# Patient Record
Sex: Female | Born: 1990 | Race: White | Hispanic: No | Marital: Single | Smoking: Never smoker
Health system: Southern US, Community
[De-identification: ages and names within clinical notes are randomized; demographics above are authoritative.]

## PROBLEM LIST (undated history)

## (undated) DIAGNOSIS — K589 Irritable bowel syndrome without diarrhea: Secondary | ICD-10-CM

## (undated) HISTORY — DX: Irritable bowel syndrome, unspecified: K58.9

---

## 2016-08-17 ENCOUNTER — Ambulatory Visit (INDEPENDENT_AMBULATORY_CARE_PROVIDER_SITE_OTHER): Payer: BLUE CROSS/BLUE SHIELD | Admitting: Family Medicine

## 2016-08-17 VITALS — BP 114/72 | HR 67 | Temp 98.0°F | Resp 17 | Ht 68.0 in | Wt 145.0 lb

## 2016-08-17 DIAGNOSIS — G479 Sleep disorder, unspecified: Secondary | ICD-10-CM | POA: Diagnosis not present

## 2016-08-17 MED ORDER — TRAZODONE HCL 100 MG PO TABS
50.0000 mg | ORAL_TABLET | Freq: Every evening | ORAL | 3 refills | Status: DC | PRN
Start: 2016-08-17 — End: 2016-08-23

## 2016-08-17 NOTE — Progress Notes (Signed)
   HPI  Patient presents today here with sleeping difficulty to establish care.  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  She states that over the last 5 years she struggled with sleep. She has tried several over-the-counter medications including Benadryl and 5 mg of melatonin. Benadryl helps but leaves her groggy in the morning.   She does have a history of anxiety and depression, however she does not feel that she is struggling with this at all right now. She denies suicidal thoughts.  She denies any substance abuse or use. She drinks coffee 1 cup in the morning, otherwise she does not drink caffeine.  She states that she usually goes to bed around midnight, often it takes at least 2 hours to fall asleep and she hopes to sleep until 8 AM. Once she falls asleep she does not have a problem staying asleep. She has had anxiety reaction to Zoloft previously.  PMH: Smoking status noted Past surgical history significant for knee surgery Past medical history of anxiety and depression. Family history significant for stroke and cancer in maternal grandfather, diabetes in paternal grandfather ROS: Per HPI  Objective: BP 114/72 (BP Location: Right Arm, Patient Position: Sitting, Cuff Size: Normal)   Pulse 67   Temp 98 F (36.7 C) (Oral)   Resp 17   Ht 5\' 8"  (1.727 m)   Wt 145 lb (65.8 kg)   SpO2 99%   BMI 22.05 kg/m  Gen: NAD, alert, cooperative with exam HEENT: NCAT, EOMI, PERRL, oropharynx clear, TMs normal bilaterally, CV: RRR, good S1/S2, no murmur Resp: CTABL, no wheezes, non-labored Abd: Positive bowel sounds Ext: No edema, warm Neuro: Alert and oriented, 1+ symmetric patellar tendon reflexes  Assessment and plan:  # Difficulty sleeping Mostly isolated issues following sleep. Her depression and anxiety do not sound like they're active. She previously had anxiety reaction to Zoloft. She has failed over-the-counter medications including melatonin and Benadryl.  She's also tried several herbal remedies. We have discussed sleep hygiene, she has a good routine. Trial of trazodone, start with 50 mg, may titrated 200 mg every night. Suspicion is that she will have good results without "hangover" at 50 or 100 mg.    Meds ordered this encounter  Medications  . traZODone (DESYREL) 100 MG tablet    Sig: Take 0.5-2 tablets (50-200 mg total) by mouth at bedtime as needed for sleep.    Dispense:  60 tablet    Refill:  3    Kevin FentonSamuel Kenijah Benningfield, MD 9:37 AM

## 2016-08-17 NOTE — Patient Instructions (Addendum)
  Great to meet you!  Try trazodone 30 minutes before bed, start with 1 tab every night, you can go down to 1/2 pill or up to 2 pills at night.       IF you received an x-ray today, you will receive an invoice from [facility]. Please contact [facility] at [phone] with questions or concerns regarding your invoice.   IF you received labwork today, you will receive an invoice from [facility]. Please contact [facility] at [phone] with questions or concerns regarding your invoice.   Our billing staff will not be able to assist you with questions regarding bills from these companies.  You will be contacted with the lab results as soon as they are available. The fastest way to get your results is to activate your My Chart account. Instructions are located on the last page of this paperwork. If you have not heard from us regarding the results in 2 weeks, please contact this office.

## 2016-08-23 ENCOUNTER — Ambulatory Visit (INDEPENDENT_AMBULATORY_CARE_PROVIDER_SITE_OTHER): Payer: BLUE CROSS/BLUE SHIELD | Admitting: Family Medicine

## 2016-08-23 VITALS — BP 120/76 | HR 50 | Temp 97.7°F | Resp 16 | Ht 68.0 in | Wt 138.0 lb

## 2016-08-23 DIAGNOSIS — F411 Generalized anxiety disorder: Secondary | ICD-10-CM

## 2016-08-23 DIAGNOSIS — G479 Sleep disorder, unspecified: Secondary | ICD-10-CM | POA: Diagnosis not present

## 2016-08-23 MED ORDER — MIRTAZAPINE 7.5 MG PO TABS: 7.5000 mg | ORAL_TABLET | Freq: Every day | ORAL | 0 refills | Status: DC

## 2016-08-23 NOTE — Progress Notes (Signed)
Chief Complaint  Patient presents with  . Medication Problem    pt. says wants Lynn different med. its not working     HPI  She reports that she has noticed that it is easier to fall asleep with Trazodone but notices that she still wakes up at 3am or 6 am and notices that she cannot go back to sleep. She is also dizzy with Trazodone.  She reports that her friend has an antianxiety medication and when she tried it she slept and stayed asleep and even overslept. She "thinks that it was some medication called benzo that starts with Lynn".  She has had five years of difficulty with sleep.   She also previously tried Concerta She has also tried Lynn few other medications for attention disorder and prior anxiety. She drinks 3 alcoholic drinks Lynn week.  No past medical history on file.  Current Outpatient Prescriptions  Medication Sig Dispense Refill  . mirtazapine (REMERON) 7.5 MG tablet Take 1 tablet (7.5 mg total) by mouth at bedtime. 15 tablet 0   No current facility-administered medications for this visit.     Allergies:  Allergies  Allergen Reactions  . Zoloft [Sertraline Hcl] Anxiety    No past surgical history on file.  Social History   Social History  . Marital status: Single    Spouse name: N/Lynn  . Number of children: N/Lynn  . Years of education: N/Lynn   Social History Main Topics  . Smoking status: Never Smoker  . Smokeless tobacco: Never Used  . Alcohol use Yes  . Drug use: No  . Sexual activity: Not Asked   Other Topics Concern  . None   Social History Narrative  . None    ROS  Objective: Vitals:   08/23/16 0853  BP: 120/76  Pulse: (!) 50  Resp: 16  Temp: 97.7 F (36.5 C)  TempSrc: Oral  SpO2: 99%  Weight: 138 lb (62.6 kg)  Height: 5\' 8"  (1.727 m)    Physical Exam  Constitutional: She is oriented to person, place, and time. She appears well-developed and well-nourished.  HENT:  Head: Normocephalic and atraumatic.  Eyes: Conjunctivae and EOM are  normal.  Pulmonary/Chest: Effort normal.  Neurological: She is alert and oriented to person, place, and time.  Psychiatric: She has Lynn normal mood and affect. Her behavior is normal. Judgment and thought content normal.      Assessment and Plan Lynn Cole was seen today for medication problem.  Diagnoses and all orders for this visit:  Difficulty sleeping -     Ambulatory referral to Psychiatry -     mirtazapine (REMERON) 7.5 MG tablet; Take 1 tablet (7.5 mg total) by mouth at bedtime.  Anxiety state -     Ambulatory referral to Psychiatry -     mirtazapine (REMERON) 7.5 MG tablet; Take 1 tablet (7.5 mg total) by mouth at bedtime.  Discussed with patient that since she has tried multiple classes of anxiety medications and various different sleep aides would recommend that she adhere to Lynn more strict sleep routine by going to bed at 10pm and waking up at 6am rather than taking Lynn medication so that she can achieve more sleep in the mornings to get enough rest for the day.  Discussed that her circadian rhythm wakes her up at 6am so it is best to listen to her body. Discussed with her that cortisol levels spike at different times but research shows that Lynn dawn spike can lead to people waking  up earlier therefore she should go to bed at an earlier time She indicated using alcohol 3 times Lynn week Discussed with her that alcohol is Lynn depressant and can affect sleep patterns therefore she should stop alcohol use Also she should never combine alcohol with medications Discussed with her that she should follow up with Psychiatry. She plans to see Psychiattry back in [place] over her winter break to establish care.  Referral generated at this office was cancelled.  Discussed risks and benefits of Mirtazapine. Discussed common side effects Discussed that she should take the medication at bedtime.  Lynn total of 40 minutes were spent face-to-face with the patient during this encounter and over half of  that time was spent on counseling and coordination of care.  Lynn Cole Lynn Cole

## 2016-08-23 NOTE — Patient Instructions (Addendum)
Insomnia Insomnia is a sleep disorder that makes it difficult to fall asleep or to stay asleep. Insomnia can cause tiredness (fatigue), low energy, difficulty concentrating, mood swings, and poor performance at work or school. There are three different ways to classify insomnia:  Difficulty falling asleep.  Difficulty staying asleep.  Waking up too early in the morning. Any type of insomnia can be long-term (chronic) or short-term (acute). Both are common. Short-term insomnia usually lasts for three months or less. Chronic insomnia occurs at least three times a week for longer than three months. What are the causes? Insomnia may be caused by another condition, situation, or substance, such as:  Anxiety.  Certain medicines.  Gastroesophageal reflux disease (GERD) or other gastrointestinal conditions.  Asthma or other breathing conditions.  Restless legs syndrome, sleep apnea, or other sleep disorders.  Chronic pain.  Menopause. This may include hot flashes.  Stroke.  Abuse of alcohol, tobacco, or illegal drugs.  Depression.  Caffeine.  Neurological disorders, such as Alzheimer disease.  An overactive thyroid (hyperthyroidism). The cause of insomnia may not be known. What increases the risk? Risk factors for insomnia include:  Gender. Women are more commonly affected than men.  Age. Insomnia is more common as you get older.  Stress. This may involve your professional or personal life.  Income. Insomnia is more common in people with lower income.  Lack of exercise.  Irregular work schedule or night shifts.  Traveling between different time zones. What are the signs or symptoms? If you have insomnia, trouble falling asleep or trouble staying asleep is the main symptom. This may lead to other symptoms, such as:  Feeling fatigued.  Feeling nervous about going to sleep.  Not feeling rested in the morning.  Having trouble concentrating.  Feeling irritable,  anxious, or depressed. How is this treated? Treatment for insomnia depends on the cause. If your insomnia is caused by an underlying condition, treatment will focus on addressing the condition. Treatment may also include:  Medicines to help you sleep.  Counseling or therapy.  Lifestyle adjustments. Follow these instructions at home:  Take medicines only as directed by your health care provider.  Keep regular sleeping and waking hours. Avoid naps.  Keep a sleep diary to help you and your health care provider figure out what could be causing your insomnia. Include:  When you sleep.  When you wake up during the night.  How well you sleep.  How rested you feel the next day.  Any side effects of medicines you are taking.  What you eat and drink.  Make your bedroom a comfortable place where it is easy to fall asleep:  Put up shades or special blackout curtains to block light from outside.  Use a white noise machine to block noise.  Keep the temperature cool.  Exercise regularly as directed by your health care provider. Avoid exercising right before bedtime.  Use relaxation techniques to manage stress. Ask your health care provider to suggest some techniques that may work well for you. These may include:  Breathing exercises.  Routines to release muscle tension.  Visualizing peaceful scenes.  Cut back on alcohol, caffeinated beverages, and cigarettes, especially close to bedtime. These can disrupt your sleep.  Do not overeat or eat spicy foods right before bedtime. This can lead to digestive discomfort that can make it hard for you to sleep.  Limit screen use before bedtime. This includes:  Watching TV.  Using your smartphone, tablet, and computer.  Stick to a   routine. This can help you fall asleep faster. Try to do a quiet activity, brush your teeth, and go to bed at the same time each night.  Get out of bed if you are still awake after 15 minutes of trying to  sleep. Keep the lights down, but try reading or doing a quiet activity. When you feel sleepy, go back to bed.  Make sure that you drive carefully. Avoid driving if you feel very sleepy.  Keep all follow-up appointments as directed by your health care provider. This is important. Contact a health care provider if:  You are tired throughout the day or have trouble in your daily routine due to sleepiness.  You continue to have sleep problems or your sleep problems get worse. Get help right away if:  You have serious thoughts about hurting yourself or someone else. This information is not intended to replace advice given to you by your health care provider. Make sure you discuss any questions you have with your health care provider. Document Released: 09/16/2000 Document Revised: 02/19/2016 Document Reviewed: 06/20/2014 Elsevier Interactive Patient Education  2017 Elsevier Inc.  

## 2016-08-29 ENCOUNTER — Telehealth: Payer: Self-pay

## 2016-08-29 DIAGNOSIS — F411 Generalized anxiety disorder: Secondary | ICD-10-CM

## 2016-08-29 DIAGNOSIS — G479 Sleep disorder, unspecified: Secondary | ICD-10-CM

## 2016-08-29 NOTE — Telephone Encounter (Signed)
Pt is needing a refill on her insomnia medication   Best number [phone]

## 2016-09-02 NOTE — Telephone Encounter (Signed)
Dr Creta LevinStallings, do you want to give RFs? I changed quantity to 30 day supply.

## 2016-09-04 MED ORDER — MIRTAZAPINE 7.5 MG PO TABS: 7.5000 mg | ORAL_TABLET | Freq: Every day | ORAL | 6 refills | Status: DC

## 2016-09-04 NOTE — Telephone Encounter (Signed)
Medication sent in with refills.

## 2017-09-13 ENCOUNTER — Emergency Department (HOSPITAL_COMMUNITY): Admission: EM | Admit: 2017-09-13 | Discharge: 2017-09-13 | Discharge status: Absent without leave | Payer: Self-pay

## 2018-06-05 ENCOUNTER — Emergency Department (HOSPITAL_COMMUNITY)
Admission: EM | Admit: 2018-06-05 | Discharge: 2018-06-06 | Disposition: A | Discharge status: Home / Self Care | Payer: BLUE CROSS/BLUE SHIELD | Attending: Emergency Medicine | Admitting: Emergency Medicine

## 2018-06-05 ENCOUNTER — Encounter (HOSPITAL_COMMUNITY): Payer: Self-pay | Admitting: Emergency Medicine

## 2018-06-05 ENCOUNTER — Other Ambulatory Visit: Payer: Self-pay

## 2018-06-05 DIAGNOSIS — M21332 Wrist drop, left wrist: Secondary | ICD-10-CM | POA: Insufficient documentation

## 2018-06-05 DIAGNOSIS — Z79899 Other long term (current) drug therapy: Secondary | ICD-10-CM | POA: Diagnosis not present

## 2018-06-05 DIAGNOSIS — G839 Paralytic syndrome, unspecified: Secondary | ICD-10-CM | POA: Diagnosis not present

## 2018-06-05 DIAGNOSIS — R200 Anesthesia of skin: Secondary | ICD-10-CM | POA: Diagnosis present

## 2018-06-05 NOTE — ED Triage Notes (Addendum)
Patient states she was cracking her knuckle and now her hand/forearm is numb. This started today. Patient did cocaine yesterday.

## 2018-06-06 ENCOUNTER — Emergency Department (HOSPITAL_COMMUNITY): Payer: BLUE CROSS/BLUE SHIELD

## 2018-06-06 LAB — RAPID URINE DRUG SCREEN, HOSP PERFORMED
Amphetamines: POSITIVE — AB
Barbiturates: NOT DETECTED
Benzodiazepines: NOT DETECTED
Cocaine: POSITIVE — AB
Opiates: NOT DETECTED
Tetrahydrocannabinol: NOT DETECTED

## 2018-06-06 LAB — COMPREHENSIVE METABOLIC PANEL
ALT: 20 U/L (ref 0–44)
AST: 32 U/L (ref 15–41)
Albumin: 4.4 g/dL (ref 3.5–5.0)
Alkaline Phosphatase: 54 U/L (ref 38–126)
Anion gap: 11 (ref 5–15)
BUN: 11 mg/dL (ref 6–20)
CO2: 27 mmol/L (ref 22–32)
Calcium: 9.5 mg/dL (ref 8.9–10.3)
Chloride: 103 mmol/L (ref 98–111)
Creatinine, Ser: 0.79 mg/dL (ref 0.44–1.00)
GFR calc Af Amer: >60 mL/min (ref >60)
GFR calc non Af Amer: >60 mL/min (ref >60)
Glucose, Bld: 91 mg/dL (ref 70–99)
Potassium: 3.5 mmol/L (ref 3.5–5.1)
Sodium: 141 mmol/L (ref 135–145)
Total Bilirubin: 0.6 mg/dL (ref 0.3–1.2)
Total Protein: 6.3 g/dL — ABNORMAL LOW (ref 6.5–8.1)

## 2018-06-06 LAB — CBC WITH DIFFERENTIAL/PLATELET
Basophils Absolute: 0 10*3/uL (ref 0.0–0.1)
Basophils Relative: 1 %
Eosinophils Absolute: 0.3 10*3/uL (ref 0.0–0.7)
Eosinophils Relative: 5 %
HCT: 36.3 % (ref 36.0–46.0)
Hemoglobin: 12.4 g/dL (ref 12.0–15.0)
Lymphocytes Relative: 39 %
Lymphs Abs: 2.8 10*3/uL (ref 0.7–4.0)
MCH: 32.3 pg (ref 26.0–34.0)
MCHC: 34.2 g/dL (ref 30.0–36.0)
MCV: 94.5 fL (ref 78.0–100.0)
Monocytes Absolute: 0.6 10*3/uL (ref 0.1–1.0)
Monocytes Relative: 8 %
Neutro Abs: 3.5 10*3/uL (ref 1.7–7.7)
Neutrophils Relative %: 47 %
Platelets: 230 10*3/uL (ref 150–400)
RBC: 3.84 MIL/uL — ABNORMAL LOW (ref 3.87–5.11)
RDW: 12.8 % (ref 11.5–15.5)
WBC: 7.3 10*3/uL (ref 4.0–10.5)

## 2018-06-06 LAB — I-STAT BETA HCG BLOOD, ED (MC, WL, AP ONLY): I-stat hCG, quantitative: <5 m[IU]/mL (ref <5)

## 2018-06-06 MED ORDER — DIAZEPAM 5 MG PO TABS: 10.0000 mg | ORAL_TABLET | Freq: Once | ORAL | Status: DC

## 2018-06-06 NOTE — Discharge Instructions (Signed)
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  Please wear wrist brace for support.  Call and follow up closely with neurologist for further evaluation.  Please avoid cocaine use as it is harmful to your health.

## 2018-06-06 NOTE — ED Notes (Signed)
Patient transported to MRI 

## 2018-06-06 NOTE — Consult Note (Signed)
[facility]  Impression:    The patient has a left sided wrist flop, she cannot lift her left wrist.   The patient was woke up in the morning yesteday.   She does not have a cold, or cough, no fever, she cannot move the left hand however, she cannot lift it.   She did cocaine yesterday, and she is on a lot of medications.   She takes buspirone, hydroxizine.   27 year old for wrist drop on the left side, with cocaine use. NO TPA bc she is outside of the window last knonw normal was two nights prior.  She has left side wrist drop.   ----- CT Head, and if negative for hemorrhage cta head/neck.  pscych consult MRI brain without contrast Consider EEG if MRI is negative.  Urine Pregnancy Test  No Asprin until bleed is ruled out.  Consdier as stroke until ruled out, by MR.  Inpatient neurology consult.  Comments:   STAT    Recommendations:   <220/110    Start antiplatelet if no obvious contraindication  Stroke protocol admission/ orderset suggested with placement on stroke floor  tele monitoring  Bedside swallow evaluation  HOB less than 30 degrees  IV Fluid hydration with NS  Euglycemia  avoid hyperthermia, PRN acetaminophen  dvt ppx  Consider inpatient neurology consultation  Discussed with ED MD  Please call with questions  -----------------------------------------------------------------------------------------  CC  History of Present Illness    The patient was up and walking and taling at 7:00 pm she was normal, up walking and talking per history.    Diagnostic:  CT head is negative.   Exam:  Patient is in no apparent distress. Patient appears as stated age. No obvious acute respiratory or cardiac distress. Patient is well groomed and well-nourished.  NIHSS score:   1A: Level of Consciousness - Requires repeated stimulation to arouse 0  1B: Ask Month and Age - 0 Questions Right 0  1C: 'Blink  Eyes' & 'Squeeze Hands' - Performs 0 Tasks 0  2: Test Horizontal Extraocular Movements - Partial Gaze Palsy: Corrects with Oculocephalic Reflex 0  3: Test Visual Fields - No Visual Loss 0  4: Test Facial Palsy - Normal symmetry 0  5A: Test Left Arm Motor Drift - Some Effort Against Gravity 0  5B: Test Right Arm Motor Drift - Some Effort Against Gravity0  6A: Test Left Leg Motor Drift - Some Effort Against Gravity 0  6B: Test Right Leg Motor Drift - Some Effort Against Gravity 0  7: Test Limb Ataxia - Ataxia in 2 Limbs 0  8: Test Sensation - Complete Loss: Cannot Sense Being Touched At All 0  9: Test Language/Aphasia- Severe Aphasia: Fragmentary Expression, Inference Needed, Cannot Identify Materials 0  10: Test Dysarthria - Mute/Anarthric 0  11: Test Extinction/Inattention - Extinction to bilateral simultaneous stimulation 0     Medical Decision Making:  - Extensive number of diagnosis or management options are considered above.  - Extensive amount of complex data reviewed.  - High risk of complication and/or morbidity or mortality are associated with differential diagnostic considerations above.  - There may be Uncertain outcome and increased probability of prolonged functional impairment or high probability of severe prolonged functional impairment associated with some of these differential diagnosis.  Medical Data Reviewed:  1.Data reviewed include clinical labs, radiology,Medical Tests;  2.Tests results discussed w/performing or interpreting physician;  3.Obtaining/reviewing old medical records;  4.Obtaining case history from another source;  5.Independent  review of image, tracing or specimen

## 2018-06-06 NOTE — ED Provider Notes (Signed)
C/o inability to move L wrist.  Cocaine last night.  ?focal infarct of radial nerve.  Currently awaits MRI.  If neg, can f/u with neuro outpt.    UDS positive for cocaine and amphetamine.  Previous test is negative, electrolytes panels are reassuring, normal liver function panel, normal WBC and normal H&H.  Initial head CT scan unremarkable, brain MRI and head MRA without concerning feature.    sxs not improving. No neck, axillary, or elbow pain.  Radial pulses 2+, diminished grip strength.  Suspect nerve palsy of indeterminate cause. Encourage cessation of Cocaine and to f/u outpt with neuro.  Wrist brace provided.  Pt voice understanding and agrees with plan.   BP 95/60   Pulse 84   Temp 97.9 F (36.6 C) (Oral)   Resp 17   Ht 5' 7.25" (1.708 m)   Wt 59 kg   SpO2 100%   BMI 20.21 kg/m   Results for orders placed or performed during the hospital encounter of 06/05/18  Comprehensive metabolic panel  Result Value Ref Range   Sodium 141 135 - 145 mmol/L   Potassium 3.5 3.5 - 5.1 mmol/L   Chloride 103 98 - 111 mmol/L   CO2 27 22 - 32 mmol/L   Glucose, Bld 91 70 - 99 mg/dL   BUN 11 6 - 20 mg/dL   Creatinine, Ser 1.61 0.44 - 1.00 mg/dL   Calcium 9.5 8.9 - 09.6 mg/dL   Total Protein 6.3 (L) 6.5 - 8.1 g/dL   Albumin 4.4 3.5 - 5.0 g/dL   AST 32 15 - 41 U/L   ALT 20 0 - 44 U/L   Alkaline Phosphatase 54 38 - 126 U/L   Total Bilirubin 0.6 0.3 - 1.2 mg/dL   GFR calc non Af Amer >60 >60 mL/min   GFR calc Af Amer >60 >60 mL/min   Anion gap 11 5 - 15  CBC with Differential  Result Value Ref Range   WBC 7.3 4.0 - 10.5 K/uL   RBC 3.84 (L) 3.87 - 5.11 MIL/uL   Hemoglobin 12.4 12.0 - 15.0 g/dL   HCT 04.5 40.9 - 81.1 %   MCV 94.5 78.0 - 100.0 fL   MCH 32.3 26.0 - 34.0 pg   MCHC 34.2 30.0 - 36.0 g/dL   RDW 91.4 78.2 - 95.6 %   Platelets 230 150 - 400 K/uL   Neutrophils Relative % 47 %   Neutro Abs 3.5 1.7 - 7.7 K/uL   Lymphocytes Relative 39 %   Lymphs Abs 2.8 0.7 - 4.0 K/uL   Monocytes Relative 8 %   Monocytes Absolute 0.6 0.1 - 1.0 K/uL   Eosinophils Relative 5 %   Eosinophils Absolute 0.3 0.0 - 0.7 K/uL   Basophils Relative 1 %   Basophils Absolute 0.0 0.0 - 0.1 K/uL  Urine rapid drug screen (hosp performed)  Result Value Ref Range   Opiates NONE DETECTED NONE DETECTED   Cocaine POSITIVE (A) NONE DETECTED   Benzodiazepines NONE DETECTED NONE DETECTED   Amphetamines POSITIVE (A) NONE DETECTED   Tetrahydrocannabinol NONE DETECTED NONE DETECTED   Barbiturates NONE DETECTED NONE DETECTED  I-Stat beta hCG blood, ED  Result Value Ref Range   I-stat hCG, quantitative <5.0 <5 mIU/mL   Comment 3           Ct Head Wo Contrast  Result Date: 06/06/2018 CLINICAL DATA:  Initial evaluation for acute right arm and hand weakness. EXAM: CT HEAD WITHOUT CONTRAST TECHNIQUE: Contiguous axial images  were obtained from the base of the skull through the vertex without intravenous contrast. COMPARISON:  None. FINDINGS: Brain: Cerebral volume within normal limits for patient age. No evidence for acute intracranial hemorrhage. No findings to suggest acute large vessel territory infarct. No mass lesion, midline shift, or mass effect. Ventricles are normal in size without evidence for hydrocephalus. No extra-axial fluid collection identified. Vascular: No hyperdense vessel identified. Skull: Scalp soft tissues demonstrate no acute abnormality. Calvarium intact. Sinuses/Orbits: Globes and orbital soft tissues within normal limits. Visualized paranasal sinuses are clear. No mastoid effusion. IMPRESSION: Normal head CT.  No acute intracranial abnormality identified. Electronically Signed   By: Rise Mu M.D.   On: 06/06/2018 06:36   Mr Maxine Glenn Head Wo Contrast  Result Date: 06/06/2018 CLINICAL DATA:  Left wrist drop.  Cocaine use. EXAM: MRI HEAD WITHOUT CONTRAST MRA HEAD WITHOUT CONTRAST TECHNIQUE: Multiplanar, multiecho pulse sequences of the brain and surrounding structures were  obtained without intravenous contrast. Angiographic images of the head were obtained using MRA technique without contrast. COMPARISON:  Head CT 06/06/2018 FINDINGS: MRI HEAD FINDINGS Brain: There is no evidence of acute infarct, intracranial hemorrhage, mass, midline shift, or extra-axial fluid collection. The ventricles and sulci are normal. The brain is normal in signal. Vascular: Major intracranial vascular flow voids are preserved. Skull and upper cervical spine: Unremarkable bone marrow signal. Sinuses/Orbits: Unremarkable orbits. Mild scattered mucosal thickening in the paranasal sinuses. Clear mastoid air cells. Other: None. MRA HEAD FINDINGS The study is mildly motion degraded. The visualized distal vertebral arteries are widely patent to the basilar and codominant. Patent PICA and SCA origins are identified bilaterally. The basilar artery is widely patent. There are moderately large posterior communicating arteries bilaterally. PCAs are patent without evidence of significant stenosis within limitations of motion artifact. The internal carotid arteries are widely patent from skull base to carotid termini. ACAs and MCAs are patent without evidence of proximal branch occlusion or significant stenosis. No aneurysm is identified. IMPRESSION: 1. Negative head MRI. 2. Negative head MRA. Electronically Signed   By: Sebastian Ache M.D.   On: 06/06/2018 11:09   Mr Brain Wo Contrast  Result Date: 06/06/2018 CLINICAL DATA:  Left wrist drop.  Cocaine use. EXAM: MRI HEAD WITHOUT CONTRAST MRA HEAD WITHOUT CONTRAST TECHNIQUE: Multiplanar, multiecho pulse sequences of the brain and surrounding structures were obtained without intravenous contrast. Angiographic images of the head were obtained using MRA technique without contrast. COMPARISON:  Head CT 06/06/2018 FINDINGS: MRI HEAD FINDINGS Brain: There is no evidence of acute infarct, intracranial hemorrhage, mass, midline shift, or extra-axial fluid collection. The  ventricles and sulci are normal. The brain is normal in signal. Vascular: Major intracranial vascular flow voids are preserved. Skull and upper cervical spine: Unremarkable bone marrow signal. Sinuses/Orbits: Unremarkable orbits. Mild scattered mucosal thickening in the paranasal sinuses. Clear mastoid air cells. Other: None. MRA HEAD FINDINGS The study is mildly motion degraded. The visualized distal vertebral arteries are widely patent to the basilar and codominant. Patent PICA and SCA origins are identified bilaterally. The basilar artery is widely patent. There are moderately large posterior communicating arteries bilaterally. PCAs are patent without evidence of significant stenosis within limitations of motion artifact. The internal carotid arteries are widely patent from skull base to carotid termini. ACAs and MCAs are patent without evidence of proximal branch occlusion or significant stenosis. No aneurysm is identified. IMPRESSION: 1. Negative head MRI. 2. Negative head MRA. Electronically Signed   By: Sebastian Ache M.D.   On: 06/06/2018  11:09      Fayrene Helper, PA-C 06/06/18 1158    Mesner, Barbara Cower, MD 06/06/18 [phone]

## 2018-06-06 NOTE — ED Provider Notes (Signed)
[facility]-EMERGENCY DEPT Provider Note   CSN: [id number] Arrival date & time: 06/05/18  2054     History   Chief Complaint Chief Complaint  Patient presents with  . Numbness    HPI Lynn Cole is a 27 y.o. female.  Patient is here for evaluation of left wrist weakness that started last night. She is unable to flex or extend the wrist voluntarily. No loss of strength or movement of digits of left hand, left elbow or shoulder. She feels the hand and forearm are numb but the weakness affects only the wrist. No headache, neck pain, injury. No pain to the left upper extremity. No swelling or discoloration. No history of similar symptoms. The patient reports snorting cocaine last night and is concerned the symptoms are related to this.   The history is provided by the patient. No language interpreter was used.    History reviewed. No pertinent past medical history.  Patient Active Problem List   Diagnosis Date Noted  . Difficulty sleeping 08/17/2016    History reviewed. No pertinent surgical history.   OB History   None      Home Medications    Prior to Admission medications   Medication Sig Start Date End Date Taking? Authorizing Provider  amphetamine-dextroamphetamine (ADDERALL) 10 MG tablet Take 10 mg by mouth daily. 05/31/18  Yes [provider]  busPIRone (BUSPAR) 15 MG tablet Take 5-15 mg by mouth 2 (two) times daily. 5 mg in the morning and 15 MG in the morning 05/31/18  Yes [provider]  hydrOXYzine (ATARAX/VISTARIL) 25 MG tablet Take 25 mg by mouth at bedtime as needed (sleep).  05/31/18  Yes [provider]  lamoTRIgine (LAMICTAL) 25 MG tablet Take 25 mg by mouth daily.   Yes [provider]  MELATONIN PO Take 1 tablet by mouth at bedtime as needed (sleep).   Yes [provider]  naproxen sodium (ALEVE) 220 MG tablet Take 220 mg by mouth daily as needed (pain).   Yes [provider]    VYVANSE 30 MG capsule Take 30 mg by mouth daily. 05/30/18  Yes [provider]  mirtazapine (REMERON) 7.5 MG tablet Take 1 tablet (7.5 mg total) by mouth at bedtime. Patient not taking: Reported on 06/06/2018 09/04/16   Doristine Bosworth, MD    Family History History reviewed. No pertinent family history.  Social History Social History   Tobacco Use  . Smoking status: Never Smoker  . Smokeless tobacco: Never Used  Substance Use Topics  . Alcohol use: Yes  . Drug use: Yes    Types: Cocaine     Allergies   Zoloft [sertraline hcl]   Review of Systems Review of Systems  Constitutional: Negative for chills and fever.  HENT: Negative.   Respiratory: Negative.   Cardiovascular: Negative.   Gastrointestinal: Negative.   Musculoskeletal: Negative.   Skin: Negative.   Neurological: Positive for weakness and numbness.       See HPI.     Physical Exam Updated Vital Signs BP 108/77 (BP Location: Left Arm)   Pulse (!) 49   Temp 98.4 F (36.9 C) (Oral)   Resp 16   Ht 5' 7.25" (1.708 m)   Wt 59 kg   SpO2 100%   BMI 20.21 kg/m   Physical Exam  Constitutional: She is oriented to person, place, and time. She appears well-developed and well-nourished.  Neck: Normal range of motion.  Cardiovascular: Intact distal pulses.  Pulmonary/Chest: Effort normal.  No respiratory distress.  Musculoskeletal:  Left wrist is limp. There is full, pain free passive ROM but no voluntary flexion or extension of wrist. Full grip strength with all digits. Full movement of elbow and shoulder with normal strength. No midline or cervical spine tenderness.   Neurological: She is alert and oriented to person, place, and time. She exhibits abnormal muscle tone.  Only mild decreased sensation to light touch of the left hand.   Skin: Skin is warm and dry. Capillary refill takes less than 2 seconds.  Psychiatric: She has a normal mood and affect.     ED Treatments / Results  Labs (all labs  ordered are listed, but only abnormal results are displayed) Labs Reviewed - No data to display  EKG None  Radiology No results found.  Procedures Procedures (including critical care time)  Medications Ordered in ED Medications - No data to display   Initial Impression / Assessment and Plan / ED Course  I have reviewed the triage vital signs and the nursing notes.  Pertinent labs & imaging results that were available during my care of the patient were reviewed by me and considered in my medical decision making (see chart for details).     Patient is here with loss of voluntary control of the left wrist while she maintains control with full strength of digits, elbow and shoulder of left extremity. Minimal subjective sensory deficits.   Concern for focal infarct, at risk secondary to cocaine use. Tele neuro consult requested with 1.5 hour delay in contacting neurologist. When consulted he recommends a head CT as initial neuroimaging, and MR and MRA of head and neck if negative CT. Patient is aware of plan and agreeable to staying for studies.   Patient care signed out to oncoming treatment team for follow up on recommended studies.  Final Clinical Impressions(s) / ED Diagnoses   Final diagnoses:  None   1. Left wrist paralysis  ED Discharge Orders    None       Elpidio Anis, PA-C 06/06/18 0703    Derwood Kaplan, MD 06/07/18 [phone]

## 2018-06-07 ENCOUNTER — Ambulatory Visit: Payer: BLUE CROSS/BLUE SHIELD | Admitting: Neurology

## 2018-06-07 ENCOUNTER — Encounter: Payer: Self-pay | Admitting: Neurology

## 2018-06-07 VITALS — BP 110/58 | HR 50 | Ht 67.25 in | Wt 121.0 lb

## 2018-06-07 DIAGNOSIS — R200 Anesthesia of skin: Secondary | ICD-10-CM

## 2018-06-07 DIAGNOSIS — R202 Paresthesia of skin: Secondary | ICD-10-CM | POA: Diagnosis not present

## 2018-06-07 DIAGNOSIS — G5632 Lesion of radial nerve, left upper limb: Secondary | ICD-10-CM

## 2018-06-07 NOTE — Progress Notes (Signed)
NEUROLOGY CONSULTATION NOTE  Lynn Cole MRN: [id number] DOB: Dec 18, 1990  Referring provider: Marily Memos, MD (ED referral) Primary care provider: No PCP.  Reason for consult:  left wrist drop  HISTORY OF PRESENT ILLNESS: Lynn Cole is a 27 year old right-handed female who presents for left wrist drop.  History supplemented by ED note.   On Tuesday, she woke up with a left wrist drop.  She used cocaine the previous day.  She presented to [facility] ED that evening for further evaluation.  CT/MRI/MRA of head were personally reviewed and were normal.  Urine drug screen was positive for cocaine.  She was provided a wrist splint and referred to outpatient neurology.  She cannot extend her wrist.  She feels numbness along the medial aspect of the left hand including index and thumb.  She denies neck pain.  She denies neck pain or radicular pain down the left arm.  Sometimes, she notes numbness and tingling in the right hand.    Her maternal grandmother had ALS.  PAST MEDICAL HISTORY: Mood disorder Depression  PAST SURGICAL HISTORY: No past surgical history on file.  MEDICATIONS: Current Outpatient Medications on File Prior to Visit  Medication Sig Dispense Refill  . amphetamine-dextroamphetamine (ADDERALL) 10 MG tablet Take 10 mg by mouth daily.  0  . busPIRone (BUSPAR) 15 MG tablet Take 5-15 mg by mouth 2 (two) times daily. 5 mg in the morning and 15 MG in the morning  0  . hydrOXYzine (ATARAX/VISTARIL) 25 MG tablet Take 25 mg by mouth at bedtime as needed (sleep).   2  . lamoTRIgine (LAMICTAL) 25 MG tablet Take 25 mg by mouth daily.    Marland Kitchen MELATONIN PO Take 1 tablet by mouth at bedtime as needed (sleep).    . mirtazapine (REMERON) 7.5 MG tablet Take 1 tablet (7.5 mg total) by mouth at bedtime. (Patient not taking: Reported on 06/06/2018) 30 tablet 6  . naproxen sodium (ALEVE) 220 MG tablet Take 220 mg by mouth daily as needed (pain).    Marland Kitchen VYVANSE 30 MG capsule Take 30 mg by  mouth daily.  0   No current facility-administered medications on file prior to visit.     ALLERGIES: Allergies  Allergen Reactions  . Zoloft [Sertraline Hcl] Anxiety    FAMILY HISTORY: Maternal grandmother:  ALS  SOCIAL HISTORY: Social History   Socioeconomic History  . Marital status: Single    Spouse name: Not on file  . Number of children: Not on file  . Years of education: Not on file  . Highest education level: Not on file  Occupational History  . Not on file  Social Needs  . Financial resource strain: Not on file  . Food insecurity:    Worry: Not on file    Inability: Not on file  . Transportation needs:    Medical: Not on file    Non-medical: Not on file  Tobacco Use  . Smoking status: Never Smoker  . Smokeless tobacco: Never Used  Substance and Sexual Activity  . Alcohol use: Yes  . Drug use: Yes    Types: Cocaine  . Sexual activity: Not on file  Lifestyle  . Physical activity:    Days per week: Not on file    Minutes per session: Not on file  . Stress: Not on file  Relationships  . Social connections:    Talks on phone: Not on file    Gets together: Not on file    Attends religious  service: Not on file    Active member of club or organization: Not on file    Attends meetings of clubs or organizations: Not on file    Relationship status: Not on file  . Intimate partner violence:    Fear of current or ex partner: Not on file    Emotionally abused: Not on file    Physically abused: Not on file    Forced sexual activity: Not on file  Other Topics Concern  . Not on file  Social History Narrative  . Not on file    REVIEW OF SYSTEMS: Constitutional: No fevers, chills, or sweats, no generalized fatigue, change in appetite Eyes: No visual changes, double vision, eye pain Ear, nose and throat: No hearing loss, ear pain, nasal congestion, sore throat Cardiovascular: No chest pain, palpitations Respiratory:  No shortness of breath at rest or with  exertion, wheezes GastrointestinaI: No nausea, vomiting, diarrhea, abdominal pain, fecal incontinence Genitourinary:  No dysuria, urinary retention or frequency Musculoskeletal:  No neck pain, back pain Integumentary: No rash, pruritus, skin lesions Neurological: as above Psychiatric: anxiety Endocrine: No palpitations, fatigue, diaphoresis, mood swings, change in appetite, change in weight, increased thirst Hematologic/Lymphatic:  No purpura, petechiae. Allergic/Immunologic: no itchy/runny eyes, nasal congestion, recent allergic reactions, rashes  PHYSICAL EXAM: Blood pressure (!) 110/58, pulse (!) 50, height 5' 7.25" (1.708 m), weight 121 lb (54.9 kg), SpO2 99 %. General: No acute distress.  Patient appears well-groomed.  Head:  Normocephalic/atraumatic Eyes:  fundi examined but not visualized Neck: supple, no paraspinal tenderness, full range of motion Back: No paraspinal tenderness Heart: regular rate and rhythm Lungs: Clear to auscultation bilaterally. Vascular: No carotid bruits. Neurological Exam: Mental status: alert and oriented to person, place, and time, recent and remote memory intact, fund of knowledge intact, attention and concentration intact, speech fluent and not dysarthric, language intact. Cranial nerves: CN I: not tested CN II: pupils equal, round and reactive to light, visual fields intact CN III, IV, VI:  full range of motion, no nystagmus, no ptosis CN V: facial sensation intact CN VII: upper and lower face symmetric CN VIII: hearing intact CN IX, X: gag intact, uvula midline CN XI: sternocleidomastoid and trapezius muscles intact CN XII: tongue midline Bulk & Tone: normal, no fasciculations. Motor:  Left wrist drop (0/5).  4+/5 left finger extension.  Otherwise, 5/5 throughout including left triceps, wrist flexion, grip, finger abductors/interossei and finger flexors. Sensation:  Pinprick and light touch sensation hyperesthetic along the left thumb; and  vibration sensation intact.  Deep Tendon Reflexes:  2+ throughout, toes downgoing.  Finger to nose testing:  Without dysmetria.   Heel to shin:  Without dysmetria.   Gait:  Normal station and stride.  Able to turn and tandem walk. Romberg negative  IMPRESSION: 1.  Left radial nerve palsy 2.  Intermittent right hand numbness and tingling, possibly mild carpal tunnel syndrome.  PLAN: 1.  We will schedule NCV-EMG of bilaterally upper extremities (for accuracy, schedule in at least 3 weeks) 2.  In the meantime, recommend continuing wearing the wrist splint and will refer to occupational therapy. 3.  Follow up after testing.  Shon Millet, DO

## 2018-06-07 NOTE — Patient Instructions (Addendum)
1.  We will schedule you for a nerve study of the arms.  For accuracy, it needs to be scheduled in 3 weeks. 2.  In the meantime, continue wearing the wrist splint and I will refer you to occupational therapy. 3.  Follow up after testing.

## 2018-06-12 ENCOUNTER — Telehealth: Payer: Self-pay | Admitting: Neurology

## 2018-06-14 ENCOUNTER — Ambulatory Visit: Payer: BLUE CROSS/BLUE SHIELD | Attending: Neurology | Admitting: Occupational Therapy

## 2018-06-14 DIAGNOSIS — M6281 Muscle weakness (generalized): Secondary | ICD-10-CM | POA: Diagnosis present

## 2018-06-14 DIAGNOSIS — R278 Other lack of coordination: Secondary | ICD-10-CM | POA: Insufficient documentation

## 2018-06-14 DIAGNOSIS — R208 Other disturbances of skin sensation: Secondary | ICD-10-CM | POA: Diagnosis present

## 2018-06-14 DIAGNOSIS — R29818 Other symptoms and signs involving the nervous system: Secondary | ICD-10-CM

## 2018-06-14 NOTE — Therapy (Signed)
[facility] [address] [place], [place], [address] Phone: [phone]   Fax:  [phone]  Occupational Therapy Evaluation  Patient Details  Name: Lynn Cole MRN: [id number] Date of Birth: 1991-05-08 Referring Provider: Dr. Shon Millet   Encounter Date: 06/14/2018  OT End of Session - 06/14/18 1003    Visit Number  1    Number of Visits  8    Date for OT Re-Evaluation  08/14/18    Authorization Type  BC/BS    OT Start Time  0800    OT Stop Time  0850    OT Time Calculation (min)  50 min    Activity Tolerance  Patient tolerated treatment well    Behavior During Therapy  [facility] for tasks assessed/performed       No past medical history on file.  No past surgical history on file.  There were no vitals filed for this visit.  Subjective Assessment - 06/14/18 0801    Pertinent History  Lt radial n. palsy 06/04/18    Patient Stated Goals  Improve Lt hand function    Currently in Pain?  No/denies        [facility] OT Assessment - 06/14/18 0001      Assessment   Medical Diagnosis  Lt radial n. palsy    Referring Provider  Dr. Shon Millet    Onset Date/Surgical Date  06/04/18    Hand Dominance  Right    Next MD Visit  about a month    Prior Therapy  none      Precautions   Precautions  None      Balance Screen   Has the patient fallen in the past 6 months  No    Has the patient had a decrease in activity level because of a fear of falling?   No    Is the patient reluctant to leave their home because of a fear of falling?   No      Home  Environment   Additional Comments  Pt lives in an apt inside a house (2 levels)     Lives With  Alone      Prior Function   Level of Independence  Independent    Vocation  Full time employment    Marine scientist. 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    Leisure  running, writing novels (typing required)      ADL   Eating/Feeding  Modified independent   unable to cut food   Grooming  Modified independent   difficulty w/ hair   Upper Body Bathing  Modified independent    Lower Body Bathing  Modified independent    Upper Body Dressing  Increased time    Lower Body Dressing  Increased time   does not tie shoes   ADL comments  extreme difficulty putting earrings in      IADL   Shopping  Shops independently for small purchases    Light Housekeeping  Performs light daily tasks such as dishwashing, bed making    Meal Prep  --   extra time   Community Mobility  Drives own vehicle    Medication Management  Is responsible for taking medication in correct dosages at correct time      Mobility   Mobility Status  Independent      Written Expression   Dominant Hand  Right    Written Experience  --  denies change     Vision - History   Baseline Vision  Wears glasses all the time      Observation/Other Assessments   Observations  Pt w/ no active wrist extension against gravity except trace movement more ulnarly. Pt unable to actively extend digits 3-5 at MP joints against gravity. Pt does demo some active MP extension at thumb and index finger      Sensation   Light Touch  Appears Intact   for light touch/localization on dorsal hand   Additional Comments  however hypersensitivity over dorsal thumb      Coordination   9 Hole Peg Test  Right;Left    Right 9 Hole Peg Test  18.28 sec    Left 9 Hole Peg Test  29.28 sec (with wrist brace on)      Edema   Edema  none      ROM / Strength   AROM / PROM / Strength  AROM      AROM   Overall AROM Comments  BUE AROM WNLs at shoulders, elbows, and forearm. Rt wrist/hand WNL's however does report occasional numbness ulnar side and does get worse w/ extreme elbow flexion (? cubital tunnel syndrome on RUE). Lt wrist: only trace extension against gravity ulnarly; in gravity elim plane, get perform wrist ext to neutral w/ compensation. Pt does have some MP  extension ring and small finger w/ tenodesis (going into wrist flexion). Long finger MP extension abscent. When wrist blocked neutral, pt demo index MP extension and thumb MP extension against gravity      Hand Function   Right Hand Grip (lbs)  57 lbs    Left Hand Grip (lbs)  21 lbs (w/o brace going into wrist flexion); 25 lbs (w/ brace on)               OT Treatments/Exercises (OP) - 06/14/18 0001      Splinting   Splinting  Pt arrived w/ wrist brace on (provided by MD office) however pt also issued another pre-fab wrist cock-up brace (D-ring) per pt request so she can alternate wearing each secondary to perspiration. Pt educated in splint wear and care and washing instructions. (Plan to make dorsal radial n. palsy splint at next session for daytime use)            OT Education - 06/14/18 1118    Education Details  wear and care of pre-fab wrist cock up splints    Person(s) Educated  Patient    Methods  Explanation;Demonstration    Comprehension  Verbalized understanding       OT Short Term Goals - 06/14/18 1112      OT SHORT TERM GOAL #1   Title  Independent with initial HEP - 07/14/18    Time  4    Period  Weeks    Status  On-going      OT SHORT TERM GOAL #2   Title  Independent with radial n. palsy splint wear and care    Time  4    Period  Weeks    Status  New      OT SHORT TERM GOAL #3   Title  Pt to demo Lt wrist extension to neutral against gravity in prep for functional tasks    Time  4    Period  Weeks    Status  New      OT SHORT TERM GOAL #4   Title  Pt to improve Lt grip strength  to 30 lbs or greater with wrist brace on     Baseline  25 lbs    Time  4    Period  Weeks    Status  New        OT Long Term Goals - 06/14/18 1115      OT LONG TERM GOAL #1   Title  Pt independent with updated HEP - 08/14/18    Time  8    Period  Weeks    Status  New      OT LONG TERM GOAL #2   Title  Pt to demo full Lt wrist extension against gravity  for functional tasks    Time  8    Period  Weeks    Status  New      OT LONG TERM GOAL #3   Title  Pt to demo 90% or greater MP extension with wrist neutral Lt hand    Time  8    Period  Weeks    Status  New      OT LONG TERM GOAL #4   Title  Pt to improve coordination Lt hand as evidenced by reducing speed on 9 hole peg test to 23 sec. or less    Baseline  29.28 sec    Time  8    Period  Weeks    Status  New      OT LONG TERM GOAL #5   Title  Pt to demo 40 lbs or greater grip strength Lt hand for gripping/assisting in opening tight jars/containers    Baseline  25 lbs w/ brace on    Time  8    Period  Weeks    Status  New            Plan - 06/14/18 1004    Clinical Impression Statement  Pt is a 27 y.o. female who presents to outpatient rehab s/p Lt radial n. palsy (after waking up on 06/04/18). Pt presents today w/ Lt wrist drop and inability to extend MP's digits 3-5, and decreased ability thumb and index finger. Pt's symptoms limit her ability to perform bilateral tasks/ADLS and work related tasks.     Occupational Profile and client history currently impacting functional performance  No significant PMH    Occupational performance deficits (Please refer to evaluation for details):  ADL's;IADL's;Work;Leisure    Rehab Potential  Good    OT Frequency  1x / week   (per pt request)    OT Duration  8 weeks    OT Treatment/Interventions  Self-care/ADL training;Moist Heat;DME and/or AE instruction;Splinting;Therapeutic activities;Therapeutic exercise;Neuromuscular education;Passive range of motion;Electrical Stimulation;Paraffin;Manual Therapy;Patient/family education;Coping strategies training    Plan  fabricate dorsal radial n. palsy splint (if still needed), issue desensitization techniques and initial HEP for wrist extension and MP extension (as able)  in gravity elim plane, try estim if time allows    Clinical Decision Making  Limited treatment options, no task modification  necessary    Consulted and Agree with Plan of Care  Patient       Patient will benefit from skilled therapeutic intervention in order to improve the following deficits and impairments:  Decreased coordination, Decreased range of motion, Decreased safety awareness, Impaired sensation, Impaired tone, Impaired UE functional use, Pain, Decreased strength  Visit Diagnosis: Other symptoms and signs involving the nervous system - Plan: Ot plan of care cert/re-cert  Muscle weakness (generalized) - Plan: Ot plan of care cert/re-cert  Other lack of coordination -  Plan: Ot plan of care cert/re-cert  Other disturbances of skin sensation - Plan: Ot plan of care cert/re-cert    Problem List Patient Active Problem List   Diagnosis Date Noted  . Difficulty sleeping 08/17/2016    Kelli Churn, OTR/L 06/14/2018, 11:21 AM  [facility] [address] [place], [place], [address] Phone: [phone]   Fax:  [phone]  Name: Lynn Cole MRN: [id number] Date of Birth: [birth year]

## 2018-06-27 ENCOUNTER — Ambulatory Visit: Payer: BLUE CROSS/BLUE SHIELD | Admitting: Occupational Therapy

## 2018-06-27 DIAGNOSIS — R29818 Other symptoms and signs involving the nervous system: Secondary | ICD-10-CM | POA: Diagnosis not present

## 2018-06-27 DIAGNOSIS — M6281 Muscle weakness (generalized): Secondary | ICD-10-CM

## 2018-06-27 NOTE — Patient Instructions (Signed)
   Your Splint This splint should initially be fitted by a healthcare practitioner.  The healthcare practitioner is responsible for providing wearing instructions and precautions to the patient, other healthcare practitioners and care provider involved in the patient's care.  This splint was custom made for you. Please read the following instructions to learn about wearing and caring for your splint.  Precautions Should your splint cause any of the following problems, remove the splint immediately and contact your therapist/physician.  Swelling  Severe Pain  Pressure Areas  Stiffness  Numbness  Do not wear your splint while operating machinery unless it has been fabricated for that purpose.  When To Wear Your Splint Where your splint according to your therapist/physician instructions. Daytime for 2-3 hours at a time, then gradually increase your wearing time during the day as tolerance to splint gets better   Care and Cleaning of Your Splint 1. Keep your splint away from open flames. 2. Your splint will lose its shape in temperatures over 135 degrees Farenheit, ( in car windows, near radiators, ovens or in hot water).  Never make any adjustments to your splint, if the splint needs adjusting remove it and make an appointment to see your therapist. 3. Your splint may be cleaned with rubbing alcohol.  Do not immerse in hot water over 135 degrees Farenheit.

## 2018-06-27 NOTE — Therapy (Signed)
[facility] [address] [place], [place], [address] Phone: [phone]   Fax:  [phone]  Occupational Therapy Treatment  Patient Details  Name: Lynn Cole MRN: [id number] Date of Birth: 1991/01/16 Referring Provider: Dr. Shon Millet   Encounter Date: 06/27/2018  OT End of Session - 06/27/18 1430    Visit Number  2    Number of Visits  8    Date for OT Re-Evaluation  08/14/18    Authorization Type  BC/BS    OT Start Time  1315    OT Stop Time  1425    OT Time Calculation (min)  70 min    Activity Tolerance  Patient tolerated treatment well    Behavior During Therapy  [facility] for tasks assessed/performed       No past medical history on file.  No past surgical history on file.  There were no vitals filed for this visit.  Subjective Assessment - 06/27/18 1420    Subjective   I lost my previous job. 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  No/denies                   OT Treatments/Exercises (OP) - 06/27/18 0001      ADLs   ADL Comments  Pt very concerned about finding a job w/ current deficits and need for splint. 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 Allso recommended pt discuss with referring neurologist, as well as getting lifting restriction      Splinting   Splinting  Fabricated and fitted radial n. palsy splint and issued. Reviewed wear and care             OT Education - 06/27/18 1422    Education Details  splint wear and care for radial n. palsy splint    Person(s) Educated  Patient    Methods  Explanation;Demonstration;Handout    Comprehension  Verbalized understanding;Returned demonstration       OT Short Term Goals - 06/27/18 1430      OT SHORT TERM GOAL  #1   Title  Independent with initial HEP - 07/14/18    Time  4    Period  Weeks    Status  On-going      OT SHORT TERM GOAL #2   Title  Independent with radial n. palsy splint wear and care    Time  4    Period  Weeks    Status  On-going      OT SHORT TERM GOAL #3   Title  Pt to demo Lt wrist extension to neutral against gravity in prep for functional tasks    Time  4    Period  Weeks    Status  New      OT SHORT TERM GOAL #4   Title  Pt to improve Lt grip strength to 30 lbs or greater with wrist brace on     Baseline  25 lbs    Time  4    Period  Weeks    Status  New        OT Long Term Goals - 06/14/18 1115      OT LONG TERM GOAL #1  Title  Pt independent with updated HEP - 08/14/18    Time  8    Period  Weeks    Status  New      OT LONG TERM GOAL #2   Title  Pt to demo full Lt wrist extension against gravity for functional tasks    Time  8    Period  Weeks    Status  New      OT LONG TERM GOAL #3   Title  Pt to demo 90% or greater MP extension with wrist neutral Lt hand    Time  8    Period  Weeks    Status  New      OT LONG TERM GOAL #4   Title  Pt to improve coordination Lt hand as evidenced by reducing speed on 9 hole peg test to 23 sec. or less    Baseline  29.28 sec    Time  8    Period  Weeks    Status  New      OT LONG TERM GOAL #5   Title  Pt to demo 40 lbs or greater grip strength Lt hand for gripping/assisting in opening tight jars/containers    Baseline  25 lbs w/ brace on    Time  8    Period  Weeks    Status  New            Plan - 06/27/18 1430    Clinical Impression Statement  Pt approximating STG #2.     Occupational Profile and client history currently impacting functional performance  No significant PMH    Occupational performance deficits (Please refer to evaluation for details):  ADL's;IADL's;Work;Leisure    Rehab Potential  Good    OT Frequency  1x / week    OT Duration  8 weeks    OT Treatment/Interventions   Self-care/ADL training;Moist Heat;DME and/or AE instruction;Splinting;Therapeutic activities;Therapeutic exercise;Neuromuscular education;Passive range of motion;Electrical Stimulation;Paraffin;Manual Therapy;Patient/family education;Coping strategies training    Plan  Issue desensitization techniques and initial HEP for wrist extension and MP extension (as able)  in gravity elim plane, try estim for wrist/finger extension, assess/adjust splint prn    Consulted and Agree with Plan of Care  Patient       Patient will benefit from skilled therapeutic intervention in order to improve the following deficits and impairments:  Decreased coordination, Decreased range of motion, Decreased safety awareness, Impaired sensation, Impaired tone, Impaired UE functional use, Pain, Decreased strength  Visit Diagnosis: Other symptoms and signs involving the nervous system  Muscle weakness (generalized)    Problem List Patient Active Problem List   Diagnosis Date Noted  . Difficulty sleeping 08/17/2016    Kelli Churn, OTR/L 06/27/2018, 2:33 PM  [facility] [address] [place], [place], [address] Phone: [phone]   Fax:  [phone]  Name: Chrisann Melaragno MRN: [id number] Date of Birth: 02-May-1991

## 2018-07-03 ENCOUNTER — Ambulatory Visit: Payer: BLUE CROSS/BLUE SHIELD | Admitting: Psychiatry

## 2018-07-03 ENCOUNTER — Encounter: Payer: Self-pay | Admitting: Psychiatry

## 2018-07-03 VITALS — BP 93/64 | HR 52

## 2018-07-03 DIAGNOSIS — F900 Attention-deficit hyperactivity disorder, predominantly inattentive type: Secondary | ICD-10-CM | POA: Diagnosis not present

## 2018-07-03 DIAGNOSIS — F39 Unspecified mood [affective] disorder: Secondary | ICD-10-CM

## 2018-07-03 DIAGNOSIS — F411 Generalized anxiety disorder: Secondary | ICD-10-CM

## 2018-07-03 MED ORDER — VYVANSE 30 MG PO CAPS: 30.0000 mg | ORAL_CAPSULE | Freq: Every day | ORAL | 0 refills | Status: DC

## 2018-07-03 MED ORDER — BUSPIRONE HCL 15 MG PO TABS: 15.0000 mg | ORAL_TABLET | Freq: Two times a day (BID) | ORAL | 2 refills | Status: DC

## 2018-07-03 MED ORDER — LAMOTRIGINE 100 MG PO TABS: ORAL_TABLET | ORAL | 1 refills | Status: DC

## 2018-07-03 NOTE — Progress Notes (Signed)
[facility] MD/PA/NP Initial Note  07/03/2018 1:23 PM Lynn Cole  MRN:  [id number]  Chief Complaint:  Chief Complaint    Anxiety; Alcohol Problem; ADD; Depression     HPI: Reports that she has been tolerating medications without difficulty. Reports that she had a "bout of insomnia" recently and it has improved this week. Reports chronic, episodic insomnia with primarily difficulty falling asleep. Reports that hydroxyzine seems to be partially effective and would like to monitor response to hydroxyzine to determine if this it the most effective med for her sleep. She reports some drowsiness initally upon awakening and that it is short duration.   Reports that anxiety has been "fine... Haven't noticed anything huge." Reports that she was fired, got a new job, sister got married, and had a flea infestation in the last month and was able to manage stress. Noticed some social anxiety with sister's wedding and reports that anxiety was likely less compared to the past. Worry and anxious thoughts are "better." Reports that she has been planning and using spreadsheets to get organized and have more predictability instead of avoidance. Reports that she has been networking and making decisions about her future and previously anxiety caused her to avoid these things. Reports increased organization. Describes adequate concentration.  "Mood has been good." Reports decreased mood lability. "I feel more stable." Denies any significant high or low moods. Reports that her energy has been "good" with some fluctuations. Mood has been slightly lower the last few days and energy has been somewhat lower as well. Motivation has been good. Would like motivation to be higher for writing and certain tasks. Appetite has been good. Craving chocolate. Had passive death wishes immediately after losing her job. Denies SI.  Reports drinking more than her goal at her sister's wedding. Reports that she is using ETOH less during the  week.   Visit Diagnosis:    ICD-10-CM   1. Generalized anxiety disorder F41.1 busPIRone (BUSPAR) 15 MG tablet  2. Mood disorder (HCC) F39 lamoTRIgine (LAMICTAL) 100 MG tablet  3. Attention deficit hyperactivity disorder (ADHD), predominantly inattentive type F90.0 VYVANSE 30 MG capsule     Past Medical History:  Past Medical History:  Diagnosis Date  . IBS (irritable bowel syndrome)    History reviewed. No pertinent surgical history.   Family History:  Family History  Problem Relation Age of Onset  . Alcohol abuse Father   . Migraines Sister     Social History:  Social History   Socioeconomic History  . Marital status: Single    Spouse name: Not on file  . Number of children: 0  . Years of education: Not on file  . Highest education level: Master's degree (e.g., MA, MS, MEng, MEd, MSW, MBA)  Occupational History  . Occupation: Risk analyst  Social Needs  . Financial resource strain: Not on file  . Food insecurity:    Worry: Not on file    Inability: Not on file  . Transportation needs:    Medical: Not on file    Non-medical: Not on file  Tobacco Use  . Smoking status: Never Smoker  . Smokeless tobacco: Never Used  Substance and Sexual Activity  . Alcohol use: Yes  . Drug use: Yes    Types: Cocaine  . Sexual activity: Not on file  Lifestyle  . Physical activity:    Days per week: Not on file    Minutes per session: Not on file  . Stress: Not on file  Relationships  .  Social connections:    Talks on phone: Not on file    Gets together: Not on file    Attends religious service: Not on file    Active member of club or organization: Not on file    Attends meetings of clubs or organizations: Not on file    Relationship status: Not on file  Other Topics Concern  . Not on file  Social History Narrative   Patient is right-handed. She lives alone in a 2nd floor apartment. She drinks 1-3 cups of coffee a day. She runs 4-5 miles most days.    Allergies:   No Known Allergies  Metabolic Disorder Labs: No results found for: HGBA1C, MPG No results found for: PROLACTIN No results found for: CHOL, TRIG, HDL, CHOLHDL, VLDL, LDLCALC No results found for: TSH  Therapeutic Level Labs: No results found for: LITHIUM No results found for: VALPROATE No components found for:  CBMZ  Current Medications: Current Outpatient Medications  Medication Sig Dispense Refill  . amphetamine-dextroamphetamine (ADDERALL) 10 MG tablet Take 10 mg by mouth daily.  0  . busPIRone (BUSPAR) 15 MG tablet Take 1 tablet (15 mg total) by mouth 2 (two) times daily. 5 mg in the morning and 15 MG in the morning 60 tablet 2  . hydrOXYzine (ATARAX/VISTARIL) 25 MG tablet Take 50-75 mg by mouth at bedtime as needed (sleep).   2  . lamoTRIgine (LAMICTAL) 100 MG tablet Take 1 tablet (100 mg total) by mouth daily for 14 days, THEN 1.5 tablets (150 mg total) daily for 14 days. 45 tablet 1  . MELATONIN PO Take 1 tablet by mouth at bedtime as needed (sleep).    . naproxen sodium (ALEVE) 220 MG tablet Take 220 mg by mouth daily as needed (pain).    Melene Muller ON 07/31/2018] VYVANSE 30 MG capsule Take 1 capsule (30 mg total) by mouth daily. 30 capsule 0   No current facility-administered medications for this visit.    Medication Side Effects: None  Orders placed this visit:  No orders of the defined types were placed in this encounter.  Physical Exam: Derm: No rashes noted Neuro: no tics, no tremors MSK: Gait WNL  Psychiatric Specialty Exam: Review of Systems  Skin: Negative.   Neurological: Positive for tingling.       Reports that she may have pinched nerve in left arm    Blood pressure 93/64, pulse (!) 52.There is no height or weight on file to calculate BMI.  General Appearance: Casual  Eye Contact:  Fair  Speech:  Normal Rate  Volume:  Normal  Mood:  Anxious  Affect:  Constricted  Thought Process:  Coherent  Orientation:  Full (Time, Place, and Person)  Thought  Content: Logical   Suicidal Thoughts:  No  Homicidal Thoughts:  No  Memory:  Immediate  Judgement:  Good  Insight:  Good  Psychomotor Activity:  Normal  Concentration:  Concentration: Good  Recall:  Good  Fund of Knowledge: Good  Language: Good  Akathisia:  NA  AIMS (if indicated): not done  Assets:  Desire for Improvement  ADL's:  Intact  Cognition: WNL  Prognosis:  Good   Screenings:  PHQ2-9     Office Visit from 08/23/2016 in Primary Care at [facility] Visit from 08/17/2016 in Primary Care at [facility] Total Score  0  0       Treatment Plan/Recommendations: Pt seen for 30 minutes and greater than 50% of session spent counseling pt and coordination  of care to include calling pharmacy at time of visit to confirm that pt has a Vyvanse script on file since pt was unsure about this. Will increase Lamictal to 100 mg po qd x 2 weeks, then increase to 150 mg po qd for mood s/s. Will continue Buspar 15 mg po BID for Anxiety. Will continue Vyvanse 30 mg po q am and Adderall 10 mg po qd for ADD. Will continue Hydroxyzine 25 mg 2-3 tabs po QHS prn insomnia. Discussed mood charting and pt was provided with copy of mood chart. Reviewed indication and mechanism of action of Lamictal.    Corie Chiquito, PMHNP

## 2018-07-04 ENCOUNTER — Ambulatory Visit: Payer: BLUE CROSS/BLUE SHIELD | Attending: Neurology | Admitting: Occupational Therapy

## 2018-07-04 DIAGNOSIS — R29818 Other symptoms and signs involving the nervous system: Secondary | ICD-10-CM | POA: Insufficient documentation

## 2018-07-04 DIAGNOSIS — R278 Other lack of coordination: Secondary | ICD-10-CM | POA: Diagnosis present

## 2018-07-04 DIAGNOSIS — R208 Other disturbances of skin sensation: Secondary | ICD-10-CM | POA: Diagnosis present

## 2018-07-04 DIAGNOSIS — M6281 Muscle weakness (generalized): Secondary | ICD-10-CM | POA: Insufficient documentation

## 2018-07-04 NOTE — Patient Instructions (Signed)
Desensitization Techniques  Perform these exercises ever 2 hours for 15 minute sessions.  Progress to the next exercises when the exercises you are doing become easy.  1)  Using light pressure, rub the various textures along with the hypersensitive area:  A.  Flannel  E.  Polyester  B.  Velvet  F.  Corduroy  C.  Wool  G.  Cotton material  D.  Terry cloth  2)  With the same textures use a firmer pressure. 3)  Use a hand held vibrator and massage along the sensitive area. 4)  With a small dowel rod, eraser on a pencil or base of an ink pen tap along the sensitive area. 5)  Use an empty roll-on deodorant bottle to roll along the sensitive area. 6)  Place your hand/forearm in separate containers of the following items:  A.  Sand  D.  Dry lentil beans  B.  Dry Rice  E.  Dry kidney beans  C.  Ball bearings F.  Dry pinto beans  1. Extension (Assistive)    Arm on table with thumb-up. Bend hand back at wrist. Hold __5__ seconds. Alternate way: Use other hand to bring hand up, then let go and try and hold. Repeat __10__ times. Do _6___ sessions per day.  2. While wearing wrist brace, push cards out with emphasis on straightening big knuckles. Go through whole deck twice.  Alternative way is to gather washcloth up layer by layer along table, then straighten back out layer by layer x 5. Do 6 sessions per day

## 2018-07-04 NOTE — Therapy (Signed)
[facility] [address] [place], [place], [address] Phone: [phone]   Fax:  [phone]  Occupational Therapy Treatment  Patient Details  Name: Lynn Cole MRN: [id number] Date of Birth: 07/13/1991 No data recorded  Encounter Date: 07/04/2018  OT End of Session - 07/04/18 0835    Visit Number  3    Number of Visits  8    Date for OT Re-Evaluation  08/14/18    Authorization Type  BC/BS    OT Start Time  0802    OT Stop Time  0842    OT Time Calculation (min)  40 min    Activity Tolerance  Patient tolerated treatment well       Past Medical History:  Diagnosis Date  . IBS (irritable bowel syndrome)     No past surgical history on file.  There were no vitals filed for this visit.  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  No/denies                   OT Treatments/Exercises (OP) - 07/04/18 0001      ADLs   ADL Comments  Pt issued desensitization techniques and reviewed      Exercises   Exercises  Wrist;Hand      Wrist Exercises   Other wrist exercises  Wrist extension in gravity eliminated plane x 10 reps - issued as HEP       Hand Exercises   Other Hand Exercises  MP extension ex's into gravity in AA/ROM through card activity - see pt instructions for details. Pt able to do w/ wrist supported      Modalities   Modalities  Electrical Stimulation      Electrical Stimulation   Electrical Stimulation Location  dorsal forearm    Electrical Stimulation Action  wrist and finger extension (stimulation helped more w/ MP extension than wrist ext, however pt has more wrist extension on her own)    Electrical Stimulation Parameters  50 pps, 250 pw, 10 sec. on/off cycle, x 10 min    Electrical  Stimulation Goals  Neuromuscular facilitation             OT Education - 07/04/18 0820    Education Details  HEP, desensitization techniques    Person(s) Educated  Patient    Methods  Explanation;Demonstration;Handout    Comprehension  Verbalized understanding;Returned demonstration       OT Short Term Goals - 07/04/18 0846      OT SHORT TERM GOAL #1   Title  Independent with initial HEP - 07/14/18    Time  4    Period  Weeks    Status  Achieved      OT SHORT TERM GOAL #2   Title  Independent with radial n. palsy splint wear and care    Time  4    Period  Weeks    Status  Achieved      OT SHORT TERM GOAL #3   Title  Pt to demo Lt wrist extension to neutral against gravity in prep for functional tasks    Time  4    Period  Weeks    Status  On-going  OT SHORT TERM GOAL #4   Title  Pt to improve Lt grip strength to 30 lbs or greater with wrist brace on     Baseline  25 lbs    Time  4    Period  Weeks    Status  New        OT Long Term Goals - 06/14/18 1115      OT LONG TERM GOAL #1   Title  Pt independent with updated HEP - 08/14/18    Time  8    Period  Weeks    Status  New      OT LONG TERM GOAL #2   Title  Pt to demo full Lt wrist extension against gravity for functional tasks    Time  8    Period  Weeks    Status  New      OT LONG TERM GOAL #3   Title  Pt to demo 90% or greater MP extension with wrist neutral Lt hand    Time  8    Period  Weeks    Status  New      OT LONG TERM GOAL #4   Title  Pt to improve coordination Lt hand as evidenced by reducing speed on 9 hole peg test to 23 sec. or less    Baseline  29.28 sec    Time  8    Period  Weeks    Status  New      OT LONG TERM GOAL #5   Title  Pt to demo 40 lbs or greater grip strength Lt hand for gripping/assisting in opening tight jars/containers    Baseline  25 lbs w/ brace on    Time  8    Period  Weeks    Status  New            Plan - 07/04/18 0846    Clinical  Impression Statement  Pt has met STG's #1-2. Pt beginning to show minimal wrist extension against gravity (more radially). Pt also has beginning MP extension digits 3-5. Pt has better MP extension index finger    Occupational Profile and client history currently impacting functional performance  No significant PMH    Occupational performance deficits (Please refer to evaluation for details):  ADL's;IADL's;Work;Leisure    Rehab Potential  Good    OT Frequency  2x / week    OT Duration  8 weeks    OT Treatment/Interventions  Self-care/ADL training;Moist Heat;DME and/or AE instruction;Splinting;Therapeutic activities;Therapeutic exercise;Neuromuscular education;Passive range of motion;Electrical Stimulation;Paraffin;Manual Therapy;Patient/family education;Coping strategies training    Plan  continue wrist and MP extension as able, work on grip strength w/ wrist brace on, estim    Consulted and Agree with Plan of Care  Patient       Patient will benefit from skilled therapeutic intervention in order to improve the following deficits and impairments:  Decreased coordination, Decreased range of motion, Decreased safety awareness, Impaired sensation, Impaired tone, Impaired UE functional use, Pain, Decreased strength  Visit Diagnosis: Other symptoms and signs involving the nervous system  Muscle weakness (generalized)    Problem List Patient Active Problem List   Diagnosis Date Noted  . Difficulty sleeping 08/17/2016    Carey Bullocks, OTR/L 07/04/2018, 8:48 AM  [facility] [address] [place], [place], [address] Phone: [phone]   Fax:  [phone]  Name: Lynn Cole MRN: [id number] Date of Birth: [birth year]

## 2018-07-10 ENCOUNTER — Ambulatory Visit (INDEPENDENT_AMBULATORY_CARE_PROVIDER_SITE_OTHER): Payer: BLUE CROSS/BLUE SHIELD | Admitting: Neurology

## 2018-07-10 DIAGNOSIS — G5632 Lesion of radial nerve, left upper limb: Secondary | ICD-10-CM

## 2018-07-10 DIAGNOSIS — R202 Paresthesia of skin: Secondary | ICD-10-CM

## 2018-07-10 DIAGNOSIS — R200 Anesthesia of skin: Secondary | ICD-10-CM

## 2018-07-10 NOTE — Procedures (Signed)
[facility]  [address] [place], [address]  [place], [place] [address] Tel: [phone] Fax:  [phone] Test Date:  07/10/2018  Patient: Lynn Cole DOB: [birth year] Physician: Nita Sickle, DO  Sex: Female Height: 5\' 7"  Ref Phys: Shon Millet, DO  ID#: [id number] Temp: 32.0 Technician:    Patient Complaints: This is a 27 year old female referred for evaluation of left wrist drop and right hand paresthesias.  NCV & EMG Findings: Extensive electrodiagnostic testing of the left upper extremity and additional studies of the right shows:  1. Bilateral median, ulnar, radial, and mixed palmar sensory responses are within normal limits. 2. Left radial motor response shows a symmetrically reduced amplitude (4.5 mV) with conduction block across the spiral groove.  Bilateral median, bilateral ulnar, and right radial motor responses are within normal limits. 3. Active motor axonal loss changes are seen affecting the extensor digitorum communis, extensor carpi radialis longus, and extensor indices proprius, with no motor unit recruitment in the extensor digitorum communis and extensor indicis proprius.  Reduced recruitment pattern is seen in the brachioradialis and triceps muscles.  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  There is no evidence of carpal tunnel syndrome affecting the upper extremities.   ___________________________ Nita Sickle, DO    Nerve Conduction Studies Anti Sensory Summary Table   Site NR Peak (ms) Norm Peak (ms) P-T Amp (V) Norm P-T Amp  Left Median Anti Sensory (2nd Digit)  Wrist    3.2 <3.3 65.7 >20  Right Median Anti Sensory (2nd Digit)  Wrist    2.9 <3.3 66.9 >20  Left Radial Anti Sensory (Base 1st Digit)  32C  Wrist    2.1 <2.7 54.4 >18  Right Radial Anti Sensory (Base 1st Digit)  Wrist    2.2 <2.7 47.7 >18  Left Ulnar Anti Sensory (5th Digit)  Wrist    2.8 <3.0  41.7 >18  Right Ulnar Anti Sensory (5th Digit)  Wrist    2.5 <3.0 42.9 >18   Motor Summary Table   Site NR Onset (ms) Norm Onset (ms) O-P Amp (mV) Norm O-P Amp Site1 Site2 Delta-0 (ms) Dist (cm) Vel (m/s) Norm Vel (m/s)  Left Median Motor (Abd Poll Brev)  Wrist    3.0 <3.9 10.9 >6 Elbow Wrist 5.0 27.5 55 >51  Elbow    8.0  10.3         Right Median Motor (Abd Poll Brev)  Wrist    2.7 <3.9 9.4 >6 Elbow Wrist 5.0 29.0 58 >51  Elbow    7.7  8.2         Left Radial Motor (Ext Ind Prop)  32C  7cm    2.4 <3.0 4.5 >6 7cm ACF 1.5 9.0 60 >51  ACF    3.9  4.4  ACF Below-El6ow 1.3 9.0 69   Below-Elbow    5.2  4.4  Below-El6ow A6ove-el6ow 0.7 3.0 43   Above-elbow    5.9  0.8         Right Radial Motor (Ext Ind Prop)  7cm    2.2 <3.0 6.3 >6 Up Arm 7cm 0.0 0.0  >51  Up Arm    2.2  6.7  Axilla Up Arm 0.1 0.0    Axilla    2.1  6.9         Site 4    2.0  7.0         Left Ulnar Motor (Abd  Dig Minimi)  32C  Wrist    2.2 <3.0 11.3 >8 B Elbow Wrist 4.2 22.5 54 >51  B Elbow    6.4  10.9  A Elbow B Elbow 1.9 10.0 53 >51  A Elbow    8.3  10.6         Right Ulnar Motor (Abd Dig Minimi)  Wrist    2.2 <3.0 11.5 >8 B Elbow Wrist 3.8 22.0 58 >51  B Elbow    6.0  10.2  A Elbow B Elbow 1.7 10.0 59 >51  A Elbow    7.7  10.1          Comparison Summary Table   Site NR Peak (ms) Norm Peak (ms) P-T Amp (V) Site1 Site2 Delta-P (ms) Norm Delta (ms)  Left Median/Ulnar Palm Comparison (Wrist - 8cm)  32C  Median Palm    1.7 <2.2 93.2 Median Palm Ulnar Palm 0.2   Ulnar Palm    1.5 <2.2 20.8      Right Median/Ulnar Palm Comparison (Wrist - 8cm)  Median Palm    1.5 <2.2 78.3 Median Palm Ulnar Palm 0.0   Ulnar Palm    1.5 <2.2 24.2       EMG   Side Muscle Ins Act Fibs Psw Fasc Number Recrt Dur Dur. Amp Amp. Poly Poly. Comment  Left 1stDorInt Nml Nml Nml Nml 1- Mod-V Nml Nml Nml Nml Nml Nml N/A  Left Ext Digitorum Nml 2+ Nml Nml NE None - - - - - - N/A  Left ExtCarRadLong Nml 1+ Nml Nml 3- Rapid Nml Nml Nml  Nml Nml Nml N/A  Left BrachioRad Nml Nml Nml Nml SMU Rapid Nml Nml Nml Nml Nml Nml N/A  Left PronatorTeres Nml Nml Nml Nml Nml Nml Nml Nml Nml Nml Nml Nml N/A  Left Ext Indicis Nml 2+ Nml Nml NE None - - - - - - N/A  Left Biceps Nml Nml Nml Nml Nml Nml Nml Nml Nml Nml Nml Nml N/A  Left Triceps Nml Nml Nml Nml 1- Rapid Nml Nml Nml Nml Nml Nml N/A  Left Deltoid Nml Nml Nml Nml Nml Nml Nml Nml Nml Nml Nml Nml N/A      Waveforms:

## 2018-07-11 ENCOUNTER — Telehealth: Payer: Self-pay | Admitting: *Deleted

## 2018-07-11 ENCOUNTER — Ambulatory Visit: Payer: BLUE CROSS/BLUE SHIELD | Admitting: Occupational Therapy

## 2018-07-11 DIAGNOSIS — R278 Other lack of coordination: Secondary | ICD-10-CM

## 2018-07-11 DIAGNOSIS — R29818 Other symptoms and signs involving the nervous system: Secondary | ICD-10-CM | POA: Diagnosis not present

## 2018-07-11 DIAGNOSIS — M6281 Muscle weakness (generalized): Secondary | ICD-10-CM

## 2018-07-11 DIAGNOSIS — R208 Other disturbances of skin sensation: Secondary | ICD-10-CM

## 2018-07-11 NOTE — Patient Instructions (Signed)
Wear wrist brace at night, if running or performing any task that my strain wrist/ get hand dirty. Your extension assist splint is designed to be worn during light functional activity, do not sleep in it. Monitor skin closely and remove splint if it is causing pressure areas or redness between fingers.  While wearing wrist brace bend and straighten your elbow and reach overhead with left arm so that it does not get stiff.  Continue with your exercises at home.

## 2018-07-11 NOTE — Telephone Encounter (Signed)
Patient given results and instructions.   

## 2018-07-11 NOTE — Telephone Encounter (Signed)
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  I would continue PT/OT for now.  I will see her back on 10/23 for her follow u

## 2018-07-12 NOTE — Therapy (Signed)
[facility] [address] [place], [place], [address] Phone: [phone]   Fax:  [phone]  Occupational Therapy Treatment  Patient Details  Name: Lynn Cole MRN: [id number] Date of Birth: June 05, 1991 No data recorded  Encounter Date: 07/11/2018  OT End of Session - 07/12/18 1351    Visit Number  4    Number of Visits  8    Date for OT Re-Evaluation  08/14/18    Authorization Type  BC/BS    OT Start Time  0807    OT Stop Time  0845    OT Time Calculation (min)  38 min       Past Medical History:  Diagnosis Date  . IBS (irritable bowel syndrome)     No past surgical history on file.  There were no vitals filed for this visit.  Subjective Assessment - 07/11/18 0809    Patient Stated Goals  Improve Lt hand function    Currently in Pain?  Yes    Pain Score  1     Pain Location  Wrist    Pain Orientation  Left    Pain Descriptors / Indicators  Aching    Pain Type  Chronic pain    Pain Onset  More than a month ago    Aggravating Factors   overuse    Pain Relieving Factors  rest              Treatment: Reviewed HEP, AA/ROM wrist flexion extension with place and holds. Education regarding splint wear. Pt slept in her extension assist splint and it rubbed the webspaces of her hand, pt was instructed only to wear during daytime and to monitor her skin closely. AR/rom elbow flexion and extension, as well as extending LUE overhead for shoulder flexion with elbow ext as pt reports arm getting stiff. NMES 50 pps, 250 pw, 10 secs cycle int 17, x 10 mins with pt performing wrist ext during on cycle. No adverse reactions. Pt attempted putty exercises while wearing wrist brace however she reports increased pain so task was discontinued.             OT Education - 07/12/18 1350    Education Details  HEP review, splint wear schedule- see pt instructions,     Person(s) Educated  Patient    Methods   Explanation;Demonstration;Handout    Comprehension  Verbalized understanding;Returned demonstration       OT Short Term Goals - 07/04/18 0846      OT SHORT TERM GOAL #1   Title  Independent with initial HEP - 07/14/18    Time  4    Period  Weeks    Status  Achieved      OT SHORT TERM GOAL #2   Title  Independent with radial n. palsy splint wear and care    Time  4    Period  Weeks    Status  Achieved      OT SHORT TERM GOAL #3   Title  Pt to demo Lt wrist extension to neutral against gravity in prep for functional tasks    Time  4    Period  Weeks    Status  On-going      OT SHORT TERM GOAL #4   Title  Pt to improve Lt grip strength to 30 lbs or greater with wrist brace on     Baseline  25 lbs    Time  4    Period  Weeks    Status  New        OT Long Term Goals - 06/14/18 1115      OT LONG TERM GOAL #1   Title  Pt independent with updated HEP - 08/14/18    Time  8    Period  Weeks    Status  New      OT LONG TERM GOAL #2   Title  Pt to demo full Lt wrist extension against gravity for functional tasks    Time  8    Period  Weeks    Status  New      OT LONG TERM GOAL #3   Title  Pt to demo 90% or greater MP extension with wrist neutral Lt hand    Time  8    Period  Weeks    Status  New      OT LONG TERM GOAL #4   Title  Pt to improve coordination Lt hand as evidenced by reducing speed on 9 hole peg test to 23 sec. or less    Baseline  29.28 sec    Time  8    Period  Weeks    Status  New      OT LONG TERM GOAL #5   Title  Pt to demo 40 lbs or greater grip strength Lt hand for gripping/assisting in opening tight jars/containers    Baseline  25 lbs w/ brace on    Time  8    Period  Weeks    Status  New            Plan - 07/12/18 1542    Clinical Impression Statement  Pt is progressing towards goals. Pt beginning to show minimal wrist extension against gravity (more radially).     Occupational Profile and client history currently impacting  functional performance  No significant PMH    Occupational performance deficits (Please refer to evaluation for details):  ADL's;IADL's;Work;Leisure    Rehab Potential  Good    OT Frequency  2x / week    OT Duration  8 weeks    OT Treatment/Interventions  Self-care/ADL training;Moist Heat;DME and/or AE instruction;Splinting;Therapeutic activities;Therapeutic exercise;Neuromuscular education;Passive range of motion;Electrical Stimulation;Paraffin;Manual Therapy;Patient/family education;Coping strategies training    Plan  continue wrist and MP extension as able,  on, estim    Consulted and Agree with Plan of Care  Patient       Patient will benefit from skilled therapeutic intervention in order to improve the following deficits and impairments:  Decreased coordination, Decreased range of motion, Decreased safety awareness, Impaired sensation, Impaired tone, Impaired UE functional use, Pain, Decreased strength  Visit Diagnosis: Other symptoms and signs involving the nervous system  Muscle weakness (generalized)  Other lack of coordination  Other disturbances of skin sensation    Problem List Patient Active Problem List   Diagnosis Date Noted  . Difficulty sleeping 08/17/2016    Lynn Cole 07/12/2018, 3:43 PM  [place] [facility] [address] [place], [place], [address] Phone: [phone]   Fax:  [phone]  Name: Lynn Cole MRN: [id number] Date of Birth: [birth year]

## 2018-07-18 ENCOUNTER — Ambulatory Visit: Payer: BLUE CROSS/BLUE SHIELD | Admitting: Occupational Therapy

## 2018-07-18 DIAGNOSIS — R29818 Other symptoms and signs involving the nervous system: Secondary | ICD-10-CM | POA: Diagnosis not present

## 2018-07-18 DIAGNOSIS — M6281 Muscle weakness (generalized): Secondary | ICD-10-CM

## 2018-07-18 DIAGNOSIS — R208 Other disturbances of skin sensation: Secondary | ICD-10-CM

## 2018-07-18 NOTE — Patient Instructions (Signed)
   1) WRIST: Extension Against Gravity    Rest arm and hand over pillow, palm down. Raise wrist up. _5__ reps per set, _2__ sets per session, _4__ sessions per day   2. Support wrist fully on pillow, lift fingers as far as you can actively, then lift further with other hand. Hold 3-5 sec. Then relax. Repeat 5 reps, 2 sets. Do 4 sessions per day.   3. MP Extension (Active)    With palm flat on table, lift thumb up off table x 5 reps, 2 sets. Then lift index finger off table x 5 reps, 2 sets. Hold __3__ seconds. Do __4__ sessions per day.   4. Extension (Assistive)     Alternate way: Use other hand to bring hand and wrist up, then let go and hold position 3-5 sec. Repeat _5___ times, 2 sets. Do __4__ sessions per day.

## 2018-07-18 NOTE — Therapy (Signed)
[facility] [address] [place], [place], [address] Phone: [phone]   Fax:  [phone]  Occupational Therapy Treatment  Patient Details  Name: Lynn Cole MRN: [id number] Date of Birth: 11-May-1991 No data recorded  Encounter Date: 07/18/2018  OT End of Session - 07/18/18 0853    Visit Number  5    Number of Visits  8    Date for OT Re-Evaluation  08/14/18    Authorization Type  BC/BS    OT Start Time  0805    OT Stop Time  0855    OT Time Calculation (min)  50 min    Activity Tolerance  Patient tolerated treatment well    Behavior During Therapy  [facility] for tasks assessed/performed       Past Medical History:  Diagnosis Date  . IBS (irritable bowel syndrome)     No past surgical history on file.  There were no vitals filed for this visit.  Subjective Assessment - 07/18/18 0811    Subjective   My forearm sometimes hurt    Pertinent History  Lt radial n. palsy 06/04/18    Patient Stated Goals  Improve Lt hand function    Currently in Pain?  No/denies                   OT Treatments/Exercises (OP) - 07/18/18 0001      ADLs   ADL Comments  Pt encouraged to continue desensitization techniques if skin still hyersensitive and to continue ROM to LUE at shoulder and elbow to prevent disuse and stiffness      Wrist Exercises   Other wrist exercises  Pt issued updated HEP since pt is progressing with movement in wrist and finger MP extension. See pt instructions for details. Pt can now lift wrist against gravity, (more radially), approx 75% but decreases with fatigue. Pt fatigues after about 5 reps. Pt also has isolated thumb and index finger extension. Pt beginning to show minimal isolated ring and small finger extension. Pt unable to isolate long finger, but can perform with other fingers when starting in MP flexion      Hand Exercises   Other Hand Exercises  See pt instructions for MP extension  ex's compositely, and isolated for thumb and index finger      Electrical Stimulation   Electrical Stimulation Location  dorsal forearm    Electrical Stimulation Action  wrist and finger extension - pt responding better to estim at this time    Electrical Stimulation Parameters  50 pps, 250 pw, 10 sec. on/off cycle x 10 min.    Electrical Stimulation Goals  Neuromuscular facilitation      Splinting   Splinting  Replaced palmer strap on radial n. palsy splint and reviewed wear and care of splint including hygiene care. Emphasized only wearing radial n. palsy splint during the day and wearing wrist brace at night.              OT Education - 07/18/18 [phone]    Education Details  updated HEP    Person(s) Educated  Patient    Methods  Explanation;Demonstration;Handout    Comprehension  Verbalized understanding;Returned demonstration       OT Short Term Goals - 07/18/18 0854      OT SHORT TERM GOAL #1   Title  Independent with initial HEP - 07/14/18    Time  4    Period  Weeks    Status  Achieved  OT SHORT TERM GOAL #2   Title  Independent with radial n. palsy splint wear and care    Time  4    Period  Weeks    Status  Achieved      OT SHORT TERM GOAL #3   Title  Pt to demo Lt wrist extension to neutral against gravity in prep for functional tasks    Time  4    Period  Weeks    Status  Achieved      OT SHORT TERM GOAL #4   Title  Pt to improve Lt grip strength to 30 lbs or greater with wrist brace on     Baseline  25 lbs    Time  4    Period  Weeks    Status  New        OT Long Term Goals - 07/18/18 0857      OT LONG TERM GOAL #1   Title  Pt independent with updated HEP - 08/14/18    Time  8    Period  Weeks    Status  On-going      OT LONG TERM GOAL #2   Title  Pt to demo full Lt wrist extension against gravity for functional tasks    Time  8    Period  Weeks    Status  On-going      OT LONG TERM GOAL #3   Title  Pt to demo 90% or greater MP  extension with wrist neutral Lt hand    Time  8    Period  Weeks    Status  On-going      OT LONG TERM GOAL #4   Title  Pt to improve coordination Lt hand as evidenced by reducing speed on 9 hole peg test to 23 sec. or less    Baseline  29.28 sec    Time  8    Period  Weeks    Status  New      OT LONG TERM GOAL #5   Title  Pt to demo 40 lbs or greater grip strength Lt hand for gripping/assisting in opening tight jars/containers    Baseline  25 lbs w/ brace on    Time  8    Period  Weeks    Status  New            Plan - 07/18/18 1610    Clinical Impression Statement  Pt progressing with wrist and finger function. Pt now actively extending wrist against gravity past neutral. Pt also demo isolated thumb and index finger extension, and improved composite MP extension digits 3-5    Occupational Profile and client history currently impacting functional performance  No significant PMH    Occupational performance deficits (Please refer to evaluation for details):  ADL's;IADL's;Work;Leisure    Rehab Potential  Good    OT Frequency  2x / week    OT Duration  8 weeks    OT Treatment/Interventions  Self-care/ADL training;Moist Heat;DME and/or AE instruction;Splinting;Therapeutic activities;Therapeutic exercise;Neuromuscular education;Passive range of motion;Electrical Stimulation;Paraffin;Manual Therapy;Patient/family education;Coping strategies training    Plan  assess ROM in wrist extension, review updated HEP prn, try gripper activity w/ wrist brace on, continue estim    Consulted and Agree with Plan of Care  Patient       Patient will benefit from skilled therapeutic intervention in order to improve the following deficits and impairments:  Decreased coordination, Decreased range of motion, Decreased safety awareness, Impaired sensation,  Impaired tone, Impaired UE functional use, Pain, Decreased strength  Visit Diagnosis: Other symptoms and signs involving the nervous  system  Muscle weakness (generalized)  Other disturbances of skin sensation    Problem List Patient Active Problem List   Diagnosis Date Noted  . Difficulty sleeping 08/17/2016    Kelli Churn, OTR/L 07/18/2018, 9:06 AM  [place] [facility] [address] [place], [place], [address] Phone: [phone]   Fax:  [phone]  Name: Ladine Kiper MRN: [id number] Date of Birth: 1991/05/04

## 2018-07-24 NOTE — Progress Notes (Signed)
NEUROLOGY FOLLOW UP OFFICE NOTE  Lynn Cole [id number]  HISTORY OF PRESENT ILLNESS: Lynn Cole is a 27 year old right-handed female who follows up for left wrist drop.  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  She was advised to continue PT/OT.  She has shown improvement.  Her therapist said she has been improving a lot.    HISTORY: On 06/05/18, she woke up with a left wrist drop.  She used cocaine the previous day.  She presented to [facility], ED that evening for further evaluation.  CT/MRI/MRA of the head were normal.  Urine drug screen was positive for cocaine.  She was provided a wrist splint and referred to outpatient neurology.  On initial evaluation, she could not extend her wrist.  She also endorsed numbness along the medial aspect of the left hand, including index and thumb.  She denies neck pain or radicular pain down the arm.  PAST MEDICAL HISTORY: Past Medical History:  Diagnosis Date  . IBS (irritable bowel syndrome)     MEDICATIONS: Current Outpatient Medications on File Prior to Visit  Medication Sig Dispense Refill  . amphetamine-dextroamphetamine (ADDERALL) 10 MG tablet Take 10 mg by mouth daily.  0  . busPIRone (BUSPAR) 15 MG tablet Take 1 tablet (15 mg total) by mouth 2 (two) times daily. 5 mg in the morning and 15 MG in the morning 60 tablet 2  . hydrOXYzine (ATARAX/VISTARIL) 25 MG tablet Take 50-75 mg by mouth at bedtime as needed (sleep).   2  . lamoTRIgine (LAMICTAL) 100 MG tablet Take 1 tablet (100 mg total) by mouth daily for 14 days, THEN 1.5 tablets (150 mg total) daily for 14 days. 45 tablet 1  . MELATONIN PO Take 1 tablet by mouth at bedtime as needed (sleep).    . naproxen sodium (ALEVE) 220 MG tablet Take 220 mg by mouth daily as needed (pain).    Melene Muller ON 07/31/2018] VYVANSE 30 MG capsule Take 1 capsule (30 mg total) by mouth  daily. 30 capsule 0   No current facility-administered medications on file prior to visit.     ALLERGIES: No Known Allergies  FAMILY HISTORY: Family History  Problem Relation Age of Onset  . Alcohol abuse Father   . Migraines Sister     SOCIAL HISTORY: Social History   Socioeconomic History  . Marital status: Single    Spouse name: Not on file  . Number of children: 0  . Years of education: Not on file  . Highest education level: Master's degree (e.g., MA, MS, MEng, MEd, MSW, MBA)  Occupational History  . Occupation: Risk analyst  Social Needs  . Financial resource strain: Not on file  . Food insecurity:    Worry: Not on file    Inability: Not on file  . Transportation needs:    Medical: Not on file    Non-medical: Not on file  Tobacco Use  . Smoking status: Never Smoker  . Smokeless tobacco: Never Used  Substance and Sexual Activity  . Alcohol use: Yes  . Drug use: Yes    Types: Cocaine  . Sexual activity: Not on file  Lifestyle  . Physical activity:    Days per week: Not on file    Minutes per session: Not on file  . Stress: Not on file  Relationships  . Social connections:    Talks on phone: Not on file  Gets together: Not on file    Attends religious service: Not on file    Active member of club or organization: Not on file    Attends meetings of clubs or organizations: Not on file    Relationship status: Not on file  . Intimate partner violence:    Fear of current or ex partner: Not on file    Emotionally abused: Not on file    Physically abused: Not on file    Forced sexual activity: Not on file  Other Topics Concern  . Not on file  Social History Narrative   Patient is right-handed. She lives alone in a 2nd floor apartment. She drinks 1-3 cups of coffee a day. She runs 4-5 miles most days.    REVIEW OF SYSTEMS: Constitutional: No fevers, chills, or sweats, no generalized fatigue, change in appetite Eyes: No visual changes, double  vision, eye pain Ear, nose and throat: No hearing loss, ear pain, nasal congestion, sore throat Cardiovascular: No chest pain, palpitations Respiratory:  No shortness of breath at rest or with exertion, wheezes GastrointestinaI: No nausea, vomiting, diarrhea, abdominal pain, fecal incontinence Genitourinary:  No dysuria, urinary retention or frequency Musculoskeletal:  No neck pain, back pain Integumentary: No rash, pruritus, skin lesions Neurological: as above Psychiatric: No depression, insomnia, anxiety Endocrine: No palpitations, fatigue, diaphoresis, mood swings, change in appetite, change in weight, increased thirst Hematologic/Lymphatic:  No purpura, petechiae. Allergic/Immunologic: no itchy/runny eyes, nasal congestion, recent allergic reactions, rashes  PHYSICAL EXAM: Blood pressure 90/62, pulse 70, height 5' 7.25" (1.708 m), weight 118 lb (53.5 kg), SpO2 99 %. General: No acute distress.  Patient appears well-groomed.  normal body habitus. Head:  Normocephalic/atraumatic Eyes:  Fundi examined but not visualized Neck: supple, no paraspinal tenderness, full range of motion Heart:  Regular rate and rhythm Lungs:  Clear to auscultation bilaterally Back: No paraspinal tenderness Neurological Exam: alert and oriented to person, place, and time. Attention span and concentration intact, recent and remote memory intact, fund of knowledge intact.  Speech fluent and not dysarthric, language intact.  CN II-XII intact. Bulk and tone normal, 4+/5 finger extension left hand, 2-3/5 left wrist extension; otherwise muscle strength 5/5 throughout.  Sensation to light touch, temperature and vibration intact.  Deep tendon reflexes 2+ throughout, toes downgoing.  Finger to nose and heel to shin testing intact.  Gait normal, Romberg negative.  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  Continue PT/OT 2.  Follow up in approximately 8 weeks  16 minutes spent face to face  with patient, over 50% spent discussing management, diagnosis and prognosis.  Shon Millet, DO

## 2018-07-25 ENCOUNTER — Ambulatory Visit: Payer: BLUE CROSS/BLUE SHIELD | Admitting: Neurology

## 2018-07-25 ENCOUNTER — Encounter: Payer: Self-pay | Admitting: Neurology

## 2018-07-25 VITALS — BP 90/62 | HR 70 | Ht 67.25 in | Wt 118.0 lb

## 2018-07-25 DIAGNOSIS — G5632 Lesion of radial nerve, left upper limb: Secondary | ICD-10-CM | POA: Diagnosis not present

## 2018-07-25 NOTE — Patient Instructions (Signed)
Continue occupational therapy/physical therapy I will let you know if there are any other recommendations

## 2018-07-29 ENCOUNTER — Encounter: Payer: Self-pay | Admitting: Emergency Medicine

## 2018-07-29 DIAGNOSIS — F39 Unspecified mood [affective] disorder: Secondary | ICD-10-CM | POA: Insufficient documentation

## 2018-07-29 DIAGNOSIS — G4700 Insomnia, unspecified: Secondary | ICD-10-CM | POA: Insufficient documentation

## 2018-07-29 DIAGNOSIS — F909 Attention-deficit hyperactivity disorder, unspecified type: Secondary | ICD-10-CM

## 2018-07-29 DIAGNOSIS — F4010 Social phobia, unspecified: Secondary | ICD-10-CM

## 2018-07-31 ENCOUNTER — Other Ambulatory Visit: Payer: Self-pay | Admitting: Psychiatry

## 2018-07-31 MED ORDER — LAMOTRIGINE 150 MG PO TABS: 150.0000 mg | ORAL_TABLET | Freq: Every day | ORAL | 1 refills | Status: DC

## 2018-07-31 NOTE — Progress Notes (Signed)
Received refill request for Lamictal. Lamictal 150 mg po qd sent to pharmacy

## 2018-08-01 ENCOUNTER — Ambulatory Visit: Payer: BLUE CROSS/BLUE SHIELD | Admitting: *Deleted

## 2018-08-08 ENCOUNTER — Ambulatory Visit: Payer: BLUE CROSS/BLUE SHIELD | Attending: Neurology | Admitting: Occupational Therapy

## 2018-08-08 ENCOUNTER — Ambulatory Visit: Payer: BLUE CROSS/BLUE SHIELD | Admitting: Psychiatry

## 2018-08-08 DIAGNOSIS — M6281 Muscle weakness (generalized): Secondary | ICD-10-CM

## 2018-08-08 DIAGNOSIS — R29818 Other symptoms and signs involving the nervous system: Secondary | ICD-10-CM

## 2018-08-08 NOTE — Therapy (Signed)
[facility] [address] [place], [place], [address] Phone: [phone]   Fax:  [phone]  Occupational Therapy Treatment  Patient Details  Name: Lynn Cole MRN: [id number] Date of Birth: 07/01/1991 No data recorded  Encounter Date: 08/08/2018  OT End of Session - 08/08/18 0852    Visit Number  6    Number of Visits  8    Date for OT Re-Evaluation  08/14/18    Authorization Type  BC/BS    OT Start Time  0805    OT Stop Time  0850    OT Time Calculation (min)  45 min    Activity Tolerance  Patient tolerated treatment well    Behavior During Therapy  [facility] for tasks assessed/performed       Past Medical History:  Diagnosis Date  . IBS (irritable bowel syndrome)     No past surgical history on file.  There were no vitals filed for this visit.  Subjective Assessment - 08/08/18 0808    Subjective   I feel like it's getting stronger    Pertinent History  Lt radial n. palsy 06/04/18    Patient Stated Goals  Improve Lt hand function    Currently in Pain?  No/denies         [facility] OT Assessment - 08/08/18 0001      Hand Function   Left Hand Grip (lbs)  24 lbs (without brace and maintaining neutral wrist to some wrist extension)        Pt had metal stays in wrist brace placed incorrectly (2 in dorsal part of brace). Therapist demo how to don metal stays correctly.  Therapist also discussed with patient peripheral nerve healing and to avoid overdoing (too many reps at one time, or lifting beyond what she can) which can make her regress, but rather focus on controlled movements, less reps at one time, but doing more frequently t/o the day (vs. All in one session).   Reviewed previously issued HEP. Emphasis placed on going through full ROM and holding positions 2-3 seconds. Added full composite extension (wrist and finger ext simultaneously) and issued putty HEP for grip strength. Pt issued yellow putty.                   OT Short Term Goals - 08/08/18 9518      OT SHORT TERM GOAL #1   Title  Independent with initial HEP - 07/14/18    Time  4    Period  Weeks    Status  Achieved      OT SHORT TERM GOAL #2   Title  Independent with radial n. palsy splint wear and care    Time  4    Period  Weeks    Status  Achieved      OT SHORT TERM GOAL #3   Title  Pt to demo Lt wrist extension to neutral against gravity in prep for functional tasks    Time  4    Period  Weeks    Status  Achieved      OT SHORT TERM GOAL #4   Title  Pt to improve Lt grip strength to 30 lbs or greater with wrist brace on     Baseline  25 lbs    Time  4    Period  Weeks    Status  On-going        OT Long Term Goals - 07/18/18 8416  OT LONG TERM GOAL #1   Title  Pt independent with updated HEP - 08/14/18    Time  8    Period  Weeks    Status  On-going      OT LONG TERM GOAL #2   Title  Pt to demo full Lt wrist extension against gravity for functional tasks    Time  8    Period  Weeks    Status  On-going      OT LONG TERM GOAL #3   Title  Pt to demo 90% or greater MP extension with wrist neutral Lt hand    Time  8    Period  Weeks    Status  On-going      OT LONG TERM GOAL #4   Title  Pt to improve coordination Lt hand as evidenced by reducing speed on 9 hole peg test to 23 sec. or less    Baseline  29.28 sec    Time  8    Period  Weeks    Status  New      OT LONG TERM GOAL #5   Title  Pt to demo 40 lbs or greater grip strength Lt hand for gripping/assisting in opening tight jars/containers    Baseline  25 lbs w/ brace on    Time  8    Period  Weeks    Status  New            Plan - 08/08/18 2725    Clinical Impression Statement  Pt is progressing significantly with wrist and finger extension. Pt now has full wrist motion against gravity, and full MP extension all digits, even isolated. Pt still fatigues quickly after 5-10 reps. Pt also now able to tolerate mild  resistance in wrist extension, and MP extension (all digits together). Pt can now perform 24 lbs grip strength without brace and able to maintain wrist in neutral to slight extension    Occupational Profile and client history currently impacting functional performance  No significant PMH    Occupational performance deficits (Please refer to evaluation for details):  ADL's;IADL's;Work;Leisure    Rehab Potential  Good    OT Frequency  2x / week    OT Duration  8 weeks    OT Treatment/Interventions  Self-care/ADL training;Moist Heat;DME and/or AE instruction;Splinting;Therapeutic activities;Therapeutic exercise;Neuromuscular education;Passive range of motion;Electrical Stimulation;Paraffin;Manual Therapy;Patient/family education;Coping strategies training    Plan  assess ROM in wrist extension, begin wrist extension with 1 lb. weight if able, try gripper activity w/ wrist brace on, check remaining STG and progress towards LTG's    Consulted and Agree with Plan of Care  Patient       Patient will benefit from skilled therapeutic intervention in order to improve the following deficits and impairments:  Decreased coordination, Decreased range of motion, Decreased safety awareness, Impaired sensation, Impaired tone, Impaired UE functional use, Pain, Decreased strength  Visit Diagnosis: Other symptoms and signs involving the nervous system  Muscle weakness (generalized)    Problem List Patient Active Problem List   Diagnosis Date Noted  . Social phobia 07/29/2018  . Mild mood disorder (HCC) 07/29/2018  . ADHD 07/29/2018  . Insomnia 07/29/2018  . Difficulty sleeping 08/17/2016    Kelli Churn, OTR/L 08/08/2018, 8:57 AM  [facility] [address] [place], [place], [address] Phone: [phone]   Fax:  [phone]  Name: Lynn Cole MRN: [id number] Date of Birth: 1991-09-18

## 2018-08-09 ENCOUNTER — Ambulatory Visit (INDEPENDENT_AMBULATORY_CARE_PROVIDER_SITE_OTHER): Payer: BLUE CROSS/BLUE SHIELD | Admitting: Psychiatry

## 2018-08-09 ENCOUNTER — Ambulatory Visit: Payer: BLUE CROSS/BLUE SHIELD | Admitting: Psychiatry

## 2018-08-09 VITALS — BP 94/63 | HR 57

## 2018-08-09 DIAGNOSIS — F39 Unspecified mood [affective] disorder: Secondary | ICD-10-CM

## 2018-08-09 DIAGNOSIS — F411 Generalized anxiety disorder: Secondary | ICD-10-CM | POA: Diagnosis not present

## 2018-08-09 DIAGNOSIS — F900 Attention-deficit hyperactivity disorder, predominantly inattentive type: Secondary | ICD-10-CM | POA: Diagnosis not present

## 2018-08-09 NOTE — Progress Notes (Signed)
Lynn Cole [id number] January 22, 1991 27 y.o.  Subjective:   Patient ID:  Lynn Cole is a 27 y.o. (DOB 11-12-1990) female.  Chief Complaint:  Chief Complaint  Patient presents with  . Depression  . Anxiety  . ADD    HPI Lynn Cole presents to the office today for follow-up of mood, anxiety, and ADD. She reports that she and her therapist have been working on mood tracking and this has been helpful. She reports that she has been having some mild mood lability and reports that it has been manageable. Reports "down mood" last week with lethargy, increased sleep and appetite, low motivation, and passive death wishes. Reports that she has experienced some elevation in mood the last few days. She reports that her anxiety has been "good." Works as a Theatre stage manager on the weekends and notices she no longer gets anxious when it gets busy and dealing with increased demands. Notices that concentration varies depending on mood state and was significantly impaired when more depressed. Concentration is more scattered when mood is elevated but able to use organizational strategies. Sleep has varied based on mood "but not so extreme as it was before." No longer having sleepless nights.   Denies SI.    Review of Systems:  Review of Systems  Musculoskeletal: Negative for gait problem.  Neurological: Negative for tremors.  Psychiatric/Behavioral:       Please refer to HPI    Medications: I have reviewed the patient's current medications.  Current Outpatient Medications  Medication Sig Dispense Refill  . [START ON 08/24/2018] amphetamine-dextroamphetamine (ADDERALL) 10 MG tablet Take 1 tablet (10 mg total) by mouth daily. 30 tablet 0  . busPIRone (BUSPAR) 15 MG tablet Take 15 mg by mouth 2 (two) times daily.    . cetirizine (ZYRTEC) 10 MG tablet Take 10 mg by mouth daily.    Marland Kitchen docusate sodium (COLACE) 100 MG capsule Take 100 mg by mouth 2 (two) times daily.    . fluticasone (FLONASE) 50 MCG/ACT nasal  spray Place into both nostrils daily.    . hydrOXYzine (ATARAX/VISTARIL) 25 MG tablet Take 50-75 mg by mouth at bedtime as needed (sleep).   2  . lamoTRIgine (LAMICTAL) 150 MG tablet Take 1 tablet (150 mg total) by mouth daily. 30 tablet 1  . MELATONIN PO Take 1 tablet by mouth at bedtime as needed (sleep).    . naproxen sodium (ALEVE) 220 MG tablet Take 220 mg by mouth daily as needed (pain).    . phenylephrine (SUDAFED PE) 10 MG TABS tablet Take 10 mg by mouth every 4 (four) hours as needed.    . Probiotic Product (ALIGN PO) Take by mouth.    Melene Muller ON 08/28/2018] VYVANSE 30 MG capsule Take 1 capsule (30 mg total) by mouth daily. 30 capsule 0  . zinc gluconate 50 MG tablet Take 50 mg by mouth daily.    Marland Kitchen lamoTRIgine (LAMICTAL) 100 MG tablet Take 1 tablet (100 mg total) by mouth daily for 14 days, THEN 1.5 tablets (150 mg total) daily for 14 days. 45 tablet 1   No current facility-administered medications for this visit.     Medication Side Effects: None  Allergies: No Known Allergies  Past Medical History:  Diagnosis Date  . IBS (irritable bowel syndrome)     Family History  Problem Relation Age of Onset  . Alcohol abuse Father   . Migraines Sister     Social History   Socioeconomic History  . Marital status: Single  Spouse name: Not on file  . Number of children: 0  . Years of education: Not on file  . Highest education level: Master's degree (e.g., MA, MS, MEng, MEd, MSW, MBA)  Occupational History  . Occupation: Risk analyst  Social Needs  . Financial resource strain: Not on file  . Food insecurity:    Worry: Not on file    Inability: Not on file  . Transportation needs:    Medical: Not on file    Non-medical: Not on file  Tobacco Use  . Smoking status: Never Smoker  . Smokeless tobacco: Never Used  Substance and Sexual Activity  . Alcohol use: Yes  . Drug use: Yes    Types: Cocaine  . Sexual activity: Not on file  Lifestyle  . Physical activity:     Days per week: Not on file    Minutes per session: Not on file  . Stress: Not on file  Relationships  . Social connections:    Talks on phone: Not on file    Gets together: Not on file    Attends religious service: Not on file    Active member of club or organization: Not on file    Attends meetings of clubs or organizations: Not on file    Relationship status: Not on file  . Intimate partner violence:    Fear of current or ex partner: Not on file    Emotionally abused: Not on file    Physically abused: Not on file    Forced sexual activity: Not on file  Other Topics Concern  . Not on file  Social History Narrative   Patient is right-handed. She lives alone in a 2nd floor apartment. She drinks 1-3 cups of coffee a day. She runs 4-5 miles most days.    Past Medical History, Surgical history, Social history, and Family history were reviewed and updated as appropriate.   Please see review of systems for further details on the patient's review from today.   Objective:   Physical Exam:  BP 94/63   Pulse (!) 57   Physical Exam  Constitutional: She is oriented to person, place, and time. She appears well-developed. No distress.  Musculoskeletal: She exhibits no deformity.  Neurological: She is alert and oriented to person, place, and time. Coordination normal.  Psychiatric: Her speech is normal and behavior is normal. Judgment and thought content normal. Her mood appears anxious. Her affect is not angry, not blunt, not labile and not inappropriate. Cognition and memory are impaired. She does not exhibit a depressed mood. She expresses no homicidal and no suicidal ideation. She expresses no suicidal plans and no homicidal plans.  Insight intact. No auditory or visual hallucinations. No delusions.  Affect constricted.  Mood presents as mildly anxious.    Lab Review:     Component Value Date/Time   NA 141 06/06/2018 0353   K 3.5 06/06/2018 0353   CL 103 06/06/2018 0353   CO2  27 06/06/2018 0353   GLUCOSE 91 06/06/2018 0353   BUN 11 06/06/2018 0353   CREATININE 0.79 06/06/2018 0353   CALCIUM 9.5 06/06/2018 0353   PROT 6.3 (L) 06/06/2018 0353   ALBUMIN 4.4 06/06/2018 0353   AST 32 06/06/2018 0353   ALT 20 06/06/2018 0353   ALKPHOS 54 06/06/2018 0353   BILITOT 0.6 06/06/2018 0353   GFRNONAA >60 06/06/2018 0353   GFRAA >60 06/06/2018 0353       Component Value Date/Time   WBC 7.3 06/06/2018 0353  RBC 3.84 (L) 06/06/2018 0353   HGB 12.4 06/06/2018 0353   HCT 36.3 06/06/2018 0353   PLT 230 06/06/2018 0353   MCV 94.5 06/06/2018 0353   MCH 32.3 06/06/2018 0353   MCHC 34.2 06/06/2018 0353   RDW 12.8 06/06/2018 0353   LYMPHSABS 2.8 06/06/2018 0353   MONOABS 0.6 06/06/2018 0353   EOSABS 0.3 06/06/2018 0353   BASOSABS 0.0 06/06/2018 0353    No results found for: POCLITH, LITHIUM   No results found for: PHENYTOIN, PHENOBARB, VALPROATE, CBMZ   .res Assessment: Plan:   Patient seen for 15 minutes and greater than 50% of visit spent counseling patient regarding mood and anxiety signs and symptoms and discussing how to build on her starting to chart her mood.  Discussed also tracking anxiety since patient reports that she is unsure about this.  She also thinks that her mood is more stable on current dose of lamotrigine and reports that she would like to track mood for another month before deciding whether to continue current dose or to increase to 200 mg daily. Will continue Lamictal 150 mg daily for mood We will continue BuSpar 15 mg twice daily for anxiety We will continue Vyvanse and Adderall for attention deficit. We will continue hydroxyzine for insomnia. Generalized anxiety disorder  Mood disorder (HCC)  Attention deficit hyperactivity disorder (ADHD), predominantly inattentive type - Plan: VYVANSE 30 MG capsule, amphetamine-dextroamphetamine (ADDERALL) 10 MG tablet  Please see After Visit Summary for patient specific instructions.  Future  Appointments  Date Time Provider Department Center  08/15/2018  8:45 AM Kelli Churn, OT [facility]  08/22/2018  8:45 AM Kelli Churn, OT [facility]  09/05/2018  8:45 AM Kelli Churn, OT [facility]  09/07/2018 10:30 AM Corie Chiquito, PMHNP [facility] None  09/12/2018  8:45 AM Kelli Churn, OT [facility]  10/02/2018 10:50 AM Drema Dallas, DO [facility] None    No orders of the defined types were placed in this encounter.     -------------------------------

## 2018-08-10 ENCOUNTER — Encounter: Payer: Self-pay | Admitting: Psychiatry

## 2018-08-10 MED ORDER — AMPHETAMINE-DEXTROAMPHETAMINE 10 MG PO TABS: 10.0000 mg | ORAL_TABLET | Freq: Every day | ORAL | 0 refills | Status: DC

## 2018-08-10 MED ORDER — VYVANSE 30 MG PO CAPS: 30.0000 mg | ORAL_CAPSULE | Freq: Every day | ORAL | 0 refills | Status: DC

## 2018-08-15 ENCOUNTER — Ambulatory Visit: Payer: BLUE CROSS/BLUE SHIELD | Admitting: Occupational Therapy

## 2018-08-22 ENCOUNTER — Ambulatory Visit: Payer: BLUE CROSS/BLUE SHIELD | Admitting: Occupational Therapy

## 2018-08-22 DIAGNOSIS — R29818 Other symptoms and signs involving the nervous system: Secondary | ICD-10-CM

## 2018-08-22 DIAGNOSIS — M6281 Muscle weakness (generalized): Secondary | ICD-10-CM

## 2018-08-22 NOTE — Therapy (Signed)
[facility] [address] [place], [place], [address] Phone: [phone]   Fax:  [phone]  Occupational Therapy Treatment  Patient Details  Name: Lynn Cole MRN: [id number] Date of Birth: [birth year] No data recorded  Encounter Date: 08/22/2018  OT End of Session - 08/22/18 1108    Visit Number  7    Number of Visits  8    Date for OT Re-Evaluation  09/09/18   extended 3 weeks due to missed weeks   Authorization Type  BC/BS    OT Start Time  0900    OT Stop Time  0930    OT Time Calculation (min)  30 min    Activity Tolerance  Patient tolerated treatment well    Behavior During Therapy  [facility] for tasks assessed/performed       Past Medical History:  Diagnosis Date  . IBS (irritable bowel syndrome)     No past surgical history on file.  There were no vitals filed for this visit.  Subjective Assessment - 08/22/18 0905    Subjective   I'm definitely improving    Pertinent History  Lt radial n. palsy 06/04/18    Patient Stated Goals  Improve Lt hand function    Currently in Pain?  No/denies         [facility] OT Assessment - 08/22/18 0001      AROM   Overall AROM Comments  wrist extension = 55* in full composite wrist/finger extension      Hand Function   Left Hand Grip (lbs)  40 lbs w/o brace       Assessed goals and progress to date (see above assessment and goal section) Issued updated strengthening HEP - see pt instructions for details. Pt performed each as instructed. Pt issued red putty today.                 OT Education - 08/22/18 0919    Education Details  updated strengthening HEP    Person(s) Educated  Patient    Methods  Explanation;Demonstration;Handout    Comprehension  Verbalized understanding;Returned demonstration       OT Short Term Goals - 08/22/18 1110      OT SHORT TERM GOAL #1   Title  Independent with initial HEP - 07/14/18    Time  4    Period  Weeks    Status  Achieved      OT SHORT TERM GOAL #2   Title  Independent with radial n. palsy splint wear and care    Time  4    Period  Weeks    Status  Achieved      OT SHORT TERM GOAL #3   Title  Pt to demo Lt wrist extension to neutral against gravity in prep for functional tasks    Time  4    Period  Weeks    Status  Achieved      OT SHORT TERM GOAL #4   Title  Pt to improve Lt grip strength to 30 lbs or greater with wrist brace on     Baseline  25 lbs    Time  4    Period  Weeks    Status  Achieved   08/22/18 = 40 lbs without brace       OT Long Term Goals - 08/22/18 1110      OT LONG TERM GOAL #1   Title  Pt independent with updated HEP - 08/14/18  Time  8    Period  Weeks    Status  Achieved      OT LONG TERM GOAL #2   Title  Pt to demo full Lt wrist extension against gravity for functional tasks    Time  8    Period  Weeks    Status  Achieved      OT LONG TERM GOAL #3   Title  Pt to demo 90% or greater MP extension with wrist neutral Lt hand    Time  8    Period  Weeks    Status  Achieved      OT LONG TERM GOAL #4   Title  Pt to improve coordination Lt hand as evidenced by reducing speed on 9 hole peg test to 23 sec. or less    Baseline  29.28 sec    Time  8    Period  Weeks    Status  On-going      OT LONG TERM GOAL #5   Title  Pt to demo 45 lbs or greater grip strength Lt hand for gripping/assisting in opening tight jars/containers    Baseline  25 lbs w/ brace on    Time  8    Period  Weeks    Status  Revised            Plan - 08/22/18 1114    Clinical Impression Statement  Pt has met all STG's and 3 LTG'S. Revised/updated LTG #5 secondary to significant improvement in grip strength. Pt demo full composite extension at wrist and fingers now, isolated finger extension, and tolerating light strengthening in wrist and finger extension.     Occupational Profile and client history currently impacting functional performance  No significant PMH     Occupational performance deficits (Please refer to evaluation for details):  ADL's;IADL's;Work;Leisure    Rehab Potential  Good    OT Frequency  2x / week    OT Duration  8 weeks    OT Treatment/Interventions  Self-care/ADL training;Moist Heat;DME and/or AE instruction;Splinting;Therapeutic activities;Therapeutic exercise;Neuromuscular education;Passive range of motion;Electrical Stimulation;Paraffin;Manual Therapy;Patient/family education;Coping strategies training    Plan  gripper activity, continue wrist and finger extension strengthening    Consulted and Agree with Plan of Care  Patient       Patient will benefit from skilled therapeutic intervention in order to improve the following deficits and impairments:  Decreased coordination, Decreased range of motion, Decreased safety awareness, Impaired sensation, Impaired tone, Impaired UE functional use, Pain, Decreased strength  Visit Diagnosis: Other symptoms and signs involving the nervous system  Muscle weakness (generalized)    Problem List Patient Active Problem List   Diagnosis Date Noted  . Social phobia 07/29/2018  . 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 ADHD 07/29/2018  . Insomnia 07/29/2018  . Difficulty sleeping 08/17/2016    Carey Bullocks, OTR/L 08/22/2018, 11:16 AM  [facility] [address] [place], [place], [address] Phone: [phone]   Fax:  [phone]  Name: Jamaira Sherk MRN: [id number] Date of Birth: 07-31-1991

## 2018-08-22 NOTE — Patient Instructions (Signed)
Wrist Extension: Resisted    With left palm down, _1___ pound weight in hand, bend wrist up. Return slowly. Repeat _10___ times per set. Do _2-3___ sessions per day. (In 1-2 weeks, once this is easy, increase to 2 lb weight)   Finger Extension / Thumb Abduction: Resisted    With rubber band around right thumb and __all______ fingers, hand slightly cupped, gently spread thumb and fingers apart. Repeat __10__ times per set. Do _3___ sessions per day.  1. Grip Strengthening (Resistive Putty)   Squeeze putty using thumb and all fingers. Repeat _10-15___ times. Do __2-3__ sessions per day.

## 2018-08-28 ENCOUNTER — Telehealth: Payer: Self-pay | Admitting: Psychiatry

## 2018-08-28 NOTE — Telephone Encounter (Signed)
Routed to provide

## 2018-08-28 NOTE — Telephone Encounter (Signed)
Patient called stating [facility] has Rx for amphetamine and should be Vyvanse.  Please correct.  Pt # is [phone]

## 2018-08-28 NOTE — Telephone Encounter (Signed)
Left voicemail that her rx is sent to pharmacy

## 2018-09-03 ENCOUNTER — Other Ambulatory Visit: Payer: Self-pay

## 2018-09-03 MED ORDER — LAMOTRIGINE 150 MG PO TABS: 150.0000 mg | ORAL_TABLET | Freq: Every day | ORAL | 0 refills | Status: DC

## 2018-09-05 ENCOUNTER — Ambulatory Visit: Payer: BLUE CROSS/BLUE SHIELD | Admitting: Occupational Therapy

## 2018-09-07 ENCOUNTER — Ambulatory Visit: Payer: BLUE CROSS/BLUE SHIELD | Admitting: Psychiatry

## 2018-09-10 ENCOUNTER — Encounter: Payer: Self-pay | Admitting: Psychiatry

## 2018-09-10 ENCOUNTER — Ambulatory Visit (INDEPENDENT_AMBULATORY_CARE_PROVIDER_SITE_OTHER): Payer: BLUE CROSS/BLUE SHIELD | Admitting: Psychiatry

## 2018-09-10 VITALS — BP 127/79 | HR 61

## 2018-09-10 DIAGNOSIS — F411 Generalized anxiety disorder: Secondary | ICD-10-CM | POA: Diagnosis not present

## 2018-09-10 DIAGNOSIS — F900 Attention-deficit hyperactivity disorder, predominantly inattentive type: Secondary | ICD-10-CM

## 2018-09-10 DIAGNOSIS — F39 Unspecified mood [affective] disorder: Secondary | ICD-10-CM

## 2018-09-10 DIAGNOSIS — F1011 Alcohol abuse, in remission: Secondary | ICD-10-CM

## 2018-09-10 MED ORDER — LISDEXAMFETAMINE DIMESYLATE 30 MG PO CAPS: 30.0000 mg | ORAL_CAPSULE | Freq: Every day | ORAL | 0 refills | Status: DC

## 2018-09-10 MED ORDER — AMPHETAMINE-DEXTROAMPHETAMINE 10 MG PO TABS: 10.0000 mg | ORAL_TABLET | Freq: Every day | ORAL | 0 refills | Status: DC

## 2018-09-10 MED ORDER — LAMOTRIGINE 150 MG PO TABS: 150.0000 mg | ORAL_TABLET | Freq: Every day | ORAL | 1 refills | Status: DC

## 2018-09-10 MED ORDER — BUSPIRONE HCL 15 MG PO TABS: 15.0000 mg | ORAL_TABLET | Freq: Two times a day (BID) | ORAL | 1 refills | Status: DC

## 2018-09-10 MED ORDER — HYDROXYZINE HCL 25 MG PO TABS: 50.0000 mg | ORAL_TABLET | Freq: Every evening | ORAL | 1 refills | Status: DC | PRN

## 2018-09-10 NOTE — Progress Notes (Signed)
Lynn Cole [id number] [birth year] 27 y.o.  Subjective:   Patient ID:  Lynn Cole is a 27 y.o. (DOB [birth year]) female.  Chief Complaint:  Chief Complaint  Patient presents with  . Follow-up    anxiety, mood lability, ADD, h/o ETOH misuse    HPI Lynn Cole presents to the office today for follow-up of mood and anxiety. She reports that she thinks Lamictal has been helpful for her mood. Reports some slight ups and downs in mood- "I've been feeling better in general." Reports some low moods but not as severe compared to the past (ie., lower energy and sleeping more without SI or more severe depressive s/s). Mood has been "good" most of the time. She reports that her anxiety has been "manageable." Denies any recent panic attacks. Reports that organization practices have been helpful for her concentration, such as sitting down for a period of 30 minutes and minimizing distractions. Reports that she made a decision to stay sober in mid-November and this has been helpful for her anxiety. Reports one episode of binge eating and that this has been decreased. Reports that she has been sleeping well and appetite has been stable. Reports improved energy and motivation. Denies any recent SI.    Review of Systems:  Review of Systems  Cardiovascular: Negative for palpitations.  Musculoskeletal: Negative for gait problem.  Skin: Negative for rash.  Neurological: Negative for tremors.  Psychiatric/Behavioral:       Please refer to HPI    Medications: I have reviewed the patient's current medications.  Current Outpatient Medications  Medication Sig Dispense Refill  . [START ON 09/21/2018] amphetamine-dextroamphetamine (ADDERALL) 10 MG tablet Take 1 tablet (10 mg total) by mouth daily. 30 tablet 0  . [START ON 10/19/2018] amphetamine-dextroamphetamine (ADDERALL) 10 MG tablet Take 1 tablet (10 mg total) by mouth daily with breakfast. 30 tablet 0  . [START ON 11/16/2018]  amphetamine-dextroamphetamine (ADDERALL) 10 MG tablet Take 1 tablet (10 mg total) by mouth daily with breakfast. 30 tablet 0  . BIOTIN PO Take by mouth.    . busPIRone (BUSPAR) 15 MG tablet Take 1 tablet (15 mg total) by mouth 2 (two) times daily. 180 tablet 1  . cetirizine (ZYRTEC) 10 MG tablet Take 10 mg by mouth daily.    . COLLAGEN PO Take by mouth.    . docusate sodium (COLACE) 100 MG capsule Take 100 mg by mouth 2 (two) times daily.    . fluticasone (FLONASE) 50 MCG/ACT nasal spray Place into both nostrils daily.    . hydrOXYzine (ATARAX/VISTARIL) 25 MG tablet Take 2-3 tablets (50-75 mg total) by mouth at bedtime as needed (sleep). 270 tablet 1  . lamoTRIgine (LAMICTAL) 150 MG tablet Take 1 tablet (150 mg total) by mouth daily. 90 tablet 1  . [START ON 09/25/2018] lisdexamfetamine (VYVANSE) 30 MG capsule Take 1 capsule (30 mg total) by mouth daily. 30 capsule 0  . [START ON 10/23/2018] lisdexamfetamine (VYVANSE) 30 MG capsule Take 1 capsule (30 mg total) by mouth daily. 30 capsule 0  . [START ON 11/20/2018] lisdexamfetamine (VYVANSE) 30 MG capsule Take 1 capsule (30 mg total) by mouth daily. 30 capsule 0  . MELATONIN PO Take 1 tablet by mouth at bedtime as needed (sleep).    . naproxen sodium (ALEVE) 220 MG tablet Take 220 mg by mouth daily as needed (pain).    . phenylephrine (SUDAFED PE) 10 MG TABS tablet Take 10 mg by mouth every 4 (four) hours as needed.    . Probiotic  Product (ALIGN PO) Take by mouth.    . zinc gluconate 50 MG tablet Take 50 mg by mouth daily.     No current facility-administered medications for this visit.     Medication Side Effects: None  Allergies: No Known Allergies  Past Medical History:  Diagnosis Date  . IBS (irritable bowel syndrome)     Family History  Problem Relation Age of Onset  . Alcohol abuse Father   . Migraines Sister     Social History   Socioeconomic History  . Marital status: Single    Spouse name: Not on file  . Number of  children: 0  . Years of education: Not on file  . Highest education level: Master's degree (e.g., MA, MS, MEng, MEd, MSW, MBA)  Occupational History  . Occupation: Risk analyst  Social Needs  . Financial resource strain: Not on file  . Food insecurity:    Worry: Not on file    Inability: Not on file  . Transportation needs:    Medical: Not on file    Non-medical: Not on file  Tobacco Use  . Smoking status: Never Smoker  . Smokeless tobacco: Never Used  Substance and Sexual Activity  . Alcohol use: Yes  . Drug use: Yes    Types: Cocaine  . Sexual activity: Not on file  Lifestyle  . Physical activity:    Days per week: Not on file    Minutes per session: Not on file  . Stress: Not on file  Relationships  . Social connections:    Talks on phone: Not on file    Gets together: Not on file    Attends religious service: Not on file    Active member of club or organization: Not on file    Attends meetings of clubs or organizations: Not on file    Relationship status: Not on file  . Intimate partner violence:    Fear of current or ex partner: Not on file    Emotionally abused: Not on file    Physically abused: Not on file    Forced sexual activity: Not on file  Other Topics Concern  . Not on file  Social History Narrative   Patient is right-handed. She lives alone in a 2nd floor apartment. She drinks 1-3 cups of coffee a day. She runs 4-5 miles most days.    Past Medical History, Surgical history, Social history, and Family history were reviewed and updated as appropriate.   Please see review of systems for further details on the patient's review from today.   Objective:   Physical Exam:  BP 127/79   Pulse 61   Physical Exam  Constitutional: She is oriented to person, place, and time. She appears well-developed. No distress.  Musculoskeletal: She exhibits no deformity.  Neurological: She is alert and oriented to person, place, and time. Coordination normal.   Psychiatric: She has a normal mood and affect. Her speech is normal and behavior is normal. Judgment and thought content normal. Her mood appears not anxious. Her affect is not angry, not blunt, not labile and not inappropriate. Cognition and memory are normal. She does not exhibit a depressed mood. She expresses no homicidal and no suicidal ideation. She expresses no suicidal plans and no homicidal plans.  Insight intact. No auditory or visual hallucinations. No delusions.  Intermittent eye contact    Lab Review:     Component Value Date/Time   NA 141 06/06/2018 0353   K 3.5 06/06/2018 0353  CL 103 06/06/2018 0353   CO2 27 06/06/2018 0353   GLUCOSE 91 06/06/2018 0353   BUN 11 06/06/2018 0353   CREATININE 0.79 06/06/2018 0353   CALCIUM 9.5 06/06/2018 0353   PROT 6.3 (L) 06/06/2018 0353   ALBUMIN 4.4 06/06/2018 0353   AST 32 06/06/2018 0353   ALT 20 06/06/2018 0353   ALKPHOS 54 06/06/2018 0353   BILITOT 0.6 06/06/2018 0353   GFRNONAA >60 06/06/2018 0353   GFRAA >60 06/06/2018 0353       Component Value Date/Time   WBC 7.3 06/06/2018 0353   RBC 3.84 (L) 06/06/2018 0353   HGB 12.4 06/06/2018 0353   HCT 36.3 06/06/2018 0353   PLT 230 06/06/2018 0353   MCV 94.5 06/06/2018 0353   MCH 32.3 06/06/2018 0353   MCHC 34.2 06/06/2018 0353   RDW 12.8 06/06/2018 0353   LYMPHSABS 2.8 06/06/2018 0353   MONOABS 0.6 06/06/2018 0353   EOSABS 0.3 06/06/2018 0353   BASOSABS 0.0 06/06/2018 0353    No results found for: POCLITH, LITHIUM   No results found for: PHENYTOIN, PHENOBARB, VALPROATE, CBMZ   .res Assessment: Plan:   Continue BuSpar 15 mg twice daily for anxiety Continue hydroxyzine 50 to 75 mg at bedtime for insomnia Continue Lamictal 150 mg daily for mood Continue Vyvanse 30 mg in the morning and Adderall 10 mg daily for attention deficit. Generalized anxiety disorder - Stable - Plan: hydrOXYzine (ATARAX/VISTARIL) 25 MG tablet, busPIRone (BUSPAR) 15 MG tablet  Mood  disorder (HCC) - stable - Plan: lamoTRIgine (LAMICTAL) 150 MG tablet  Attention deficit hyperactivity disorder (ADHD), predominantly inattentive type - Stable - Plan: lisdexamfetamine (VYVANSE) 30 MG capsule, lisdexamfetamine (VYVANSE) 30 MG capsule, lisdexamfetamine (VYVANSE) 30 MG capsule, amphetamine-dextroamphetamine (ADDERALL) 10 MG tablet, amphetamine-dextroamphetamine (ADDERALL) 10 MG tablet, amphetamine-dextroamphetamine (ADDERALL) 10 MG tablet  Alcohol abuse, 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  Future Appointments  Date Time Provider Department Center  09/12/2018  8:45 AM Kelli ChurnBallie, Kelly Johnson, OT [facility]  10/02/2018 10:50 AM Drema DallasJaffe, Adam R, DO [facility] None  12/12/2018  9:00 AM Corie Chiquitoarter, Bradee Common, PM[facility] None    No orders of the defined types were placed in this encounter.     -------------------------------

## 2018-09-12 ENCOUNTER — Ambulatory Visit: Payer: BLUE CROSS/BLUE SHIELD | Attending: Neurology | Admitting: Occupational Therapy

## 2018-09-12 ENCOUNTER — Ambulatory Visit: Payer: BLUE CROSS/BLUE SHIELD | Admitting: Occupational Therapy

## 2018-10-01 NOTE — Progress Notes (Deleted)
NEUROLOGY FOLLOW UP OFFICE NOTE  Lynn KnucklesSiobhan Pokorney [id number]  HISTORY OF PRESENT ILLNESS: Lynn Cole is a 27 year old right-handed female who follows up for left wrist drop.  UPDATE:  She is continued PT/OT.***  HISTORY:  On 06/05/2018, she woke up with a left wrist drop.  She used cocaine the previous day.  [facility].  CT/MRI/MRA of the head were normal.  Urine drug screen was positive for cocaine.  She was provided a wrist splint and referred to outpatient neurology.  On initial evaluation, she could not extend her wrist.  She also endorsed numbness along the medial aspect of her left hand, including index and thumb.  She denied neck pain or radicular pain down the arm.  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  She was advised to continue PT/OT.  PAST MEDICAL HISTORY: Past Medical History:  Diagnosis Date  . IBS (irritable bowel syndrome)     MEDICATIONS: Current Outpatient Medications on File Prior to Visit  Medication Sig Dispense Refill  . amphetamine-dextroamphetamine (ADDERALL) 10 MG tablet Take 1 tablet (10 mg total) by mouth daily. 30 tablet 0  . [START ON 10/19/2018] amphetamine-dextroamphetamine (ADDERALL) 10 MG tablet Take 1 tablet (10 mg total) by mouth daily with breakfast. 30 tablet 0  . [START ON 11/16/2018] amphetamine-dextroamphetamine (ADDERALL) 10 MG tablet Take 1 tablet (10 mg total) by mouth daily with breakfast. 30 tablet 0  . BIOTIN PO Take by mouth.    . busPIRone (BUSPAR) 15 MG tablet Take 1 tablet (15 mg total) by mouth 2 (two) times daily. 180 tablet 1  . cetirizine (ZYRTEC) 10 MG tablet Take 10 mg by mouth daily.    . COLLAGEN PO Take by mouth.    . docusate sodium (COLACE) 100 MG capsule Take 100 mg by mouth 2 (two) times daily.    . fluticasone (FLONASE) 50 MCG/ACT nasal spray Place into  both nostrils daily.    . hydrOXYzine (ATARAX/VISTARIL) 25 MG tablet Take 2-3 tablets (50-75 mg total) by mouth at bedtime as needed (sleep). 270 tablet 1  . lamoTRIgine (LAMICTAL) 150 MG tablet Take 1 tablet (150 mg total) by mouth daily. 90 tablet 1  . lisdexamfetamine (VYVANSE) 30 MG capsule Take 1 capsule (30 mg total) by mouth daily. 30 capsule 0  . [START ON 10/23/2018] lisdexamfetamine (VYVANSE) 30 MG capsule Take 1 capsule (30 mg total) by mouth daily. 30 capsule 0  . [START ON 11/20/2018] lisdexamfetamine (VYVANSE) 30 MG capsule Take 1 capsule (30 mg total) by mouth daily. 30 capsule 0  . MELATONIN PO Take 1 tablet by mouth at bedtime as needed (sleep).    . naproxen sodium (ALEVE) 220 MG tablet Take 220 mg by mouth daily as needed (pain).    . phenylephrine (SUDAFED PE) 10 MG TABS tablet Take 10 mg by mouth every 4 (four) hours as needed.    . Probiotic Product (ALIGN PO) Take by mouth.    . zinc gluconate 50 MG tablet Take 50 mg by mouth daily.     No current facility-administered medications on file prior to visit.     ALLERGIES: No Known Allergies  FAMILY HISTORY: Family History  Problem Relation Age of Onset  . Alcohol abuse Father   . Migraines Sister     SOCIAL HISTORY: Social History   Socioeconomic History  . Marital status: Single  Spouse name: Not on file  . Number of children: 0  . Years of education: Not on file  . Highest education level: Master's degree (e.g., MA, MS, MEng, MEd, MSW, MBA)  Occupational History  . 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 Financial resource strain: Not on file  . Food insecurity:    Worry: Not on file    Inability: Not on file  . Transportation needs:    Medical: Not on file    Non-medical: Not on file  Tobacco Use  . Smoking status: Never Smoker  . Smokeless tobacco: Never Used  Substance and Sexual Activity  . Alcohol use: Yes  . Drug use: Yes    Types: Cocaine  . Sexual activity: Not on file  Lifestyle   . Physical activity:    Days per week: Not on file    Minutes per session: Not on file  . Stress: Not on file  Relationships  . Social connections:    Talks on phone: Not on file    Gets together: Not on file    Attends religious service: Not on file    Active member of club or organization: Not on file    Attends meetings of clubs or organizations: Not on file    Relationship status: Not on file  . Intimate partner violence:    Fear of current or ex partner: Not on file    Emotionally abused: Not on file    Physically abused: Not on file    Forced sexual activity: Not on file  Other Topics Concern  . Not on file  Social History Narrative   Patient is right-handed. She lives alone in a 2nd floor apartment. She drinks 1-3 cups of coffee a day. She runs 4-5 miles most days.    REVIEW OF SYSTEMS: Constitutional: No fevers, chills, or sweats, no generalized fatigue, change in appetite Eyes: No visual changes, double vision, eye pain Ear, nose and throat: No hearing loss, ear pain, nasal congestion, sore throat Cardiovascular: No chest pain, palpitations Respiratory:  No shortness of breath at rest or with exertion, wheezes GastrointestinaI: No nausea, vomiting, diarrhea, abdominal pain, fecal incontinence Genitourinary:  No dysuria, urinary retention or frequency Musculoskeletal:  No neck pain, back pain Integumentary: No rash, pruritus, skin lesions Neurological: as above Psychiatric: No depression, insomnia, anxiety Endocrine: No palpitations, fatigue, diaphoresis, mood swings, change in appetite, change in weight, increased thirst Hematologic/Lymphatic:  No purpura, petechiae. Allergic/Immunologic: no itchy/runny eyes, nasal congestion, recent allergic reactions, rashes  PHYSICAL EXAM: *** General: No acute distress.  Patient appears ***-groomed.  *** body habitus. Head:  Normocephalic/atraumatic Eyes:  Fundi examined but not visualized Neck: supple, no paraspinal  tenderness, full range of motion Heart:  Regular rate and rhythm Lungs:  Clear to auscultation bilaterally Back: No paraspinal tenderness Neurological Exam: alert and oriented to person, place, and time. Attention span and concentration intact, recent and remote memory intact, fund of knowledge intact.  Speech fluent and not dysarthric, language intact.  CN II-XII intact. Bulk and tone normal, muscle strength 4+/5 finger extension left hand, 2-3/5 left wrist extension; otherwise, 5/5 throughout.  Sensation to light touch, temperature and vibration intact.  Deep tendon reflexes 2+ throughout, toes downgoing.  Finger to nose and heel to shin testing intact.  Gait normal, Romberg negative.  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

## 2018-10-02 ENCOUNTER — Ambulatory Visit: Payer: BLUE CROSS/BLUE SHIELD | Admitting: Neurology

## 2018-11-07 NOTE — Therapy (Signed)
[facility] [address] [place], [place], [address] Phone: [phone]   Fax:  [phone]  Patient Details  Name: Lynn Cole MRN: [id number] Date of Birth: Oct 18, 1990 Referring Provider:  No ref. provider found  Encounter Date: 11/07/2018  OCCUPATIONAL THERAPY DISCHARGE SUMMARY  Visits from Start of Care: 7  Current functional level related to goals / functional outcomes: OT Short Term Goals - 08/22/18 1110      OT SHORT TERM GOAL #1   Title  Independent with initial HEP - 07/14/18    Time  4    Period  Weeks    Status  Achieved      OT SHORT TERM GOAL #2   Title  Independent with radial n. palsy splint wear and care    Time  4    Period  Weeks    Status  Achieved      OT SHORT TERM GOAL #3   Title  Pt to demo Lt wrist extension to neutral against gravity in prep for functional tasks    Time  4    Period  Weeks    Status  Achieved      OT SHORT TERM GOAL #4   Title  Pt to improve Lt grip strength to 30 lbs or greater with wrist brace on     Baseline  25 lbs    Time  4    Period  Weeks    Status  Achieved   08/22/18 = 40 lbs without brace     OT Long Term Goals - 08/22/18 1110      OT LONG TERM GOAL #1   Title  Pt independent with updated HEP - 08/14/18    Time  8    Period  Weeks    Status  Achieved      OT LONG TERM GOAL #2   Title  Pt to demo full Lt wrist extension against gravity for functional tasks    Time  8    Period  Weeks    Status  Achieved      OT LONG TERM GOAL #3   Title  Pt to demo 90% or greater MP extension with wrist neutral Lt hand    Time  8    Period  Weeks    Status  Achieved      OT LONG TERM GOAL #4   Title  Pt to improve coordination Lt hand as evidenced by reducing speed on 9 hole peg test to 23 sec. or less    Baseline  29.28 sec    Time  8    Period  Weeks    Status  On-going      OT LONG TERM GOAL #5   Title  Pt to demo 45 lbs or greater grip strength  Lt hand for gripping/assisting in opening tight jars/containers    Baseline  25 lbs w/ brace on    Time  8    Period  Weeks    Status  Revised      Remaining 2 goals unable to assess secondary to pt not returning after 08/22/18   Remaining deficits: strength   Education / Equipment: Multiple progressive HEPs, splint wear and care  Plan:  Patient goals were partially met. Patient is being discharged due to not returning since the last visit.  ?????      Carey Bullocks, OTR/L 11/07/2018, 10:38 AM  [facility] [address] [place], [place], [address] Phone: [phone]   Fax:  [phone]

## 2018-12-12 ENCOUNTER — Ambulatory Visit: Payer: BLUE CROSS/BLUE SHIELD | Admitting: Psychiatry

## 2018-12-12 ENCOUNTER — Telehealth: Payer: Self-pay | Admitting: Psychiatry

## 2018-12-12 DIAGNOSIS — F900 Attention-deficit hyperactivity disorder, predominantly inattentive type: Secondary | ICD-10-CM

## 2018-12-12 DIAGNOSIS — F411 Generalized anxiety disorder: Secondary | ICD-10-CM

## 2018-12-12 DIAGNOSIS — F39 Unspecified mood [affective] disorder: Secondary | ICD-10-CM

## 2018-12-12 MED ORDER — LISDEXAMFETAMINE DIMESYLATE 30 MG PO CAPS: 30.0000 mg | ORAL_CAPSULE | Freq: Every day | ORAL | 0 refills | Status: DC

## 2018-12-12 MED ORDER — BUSPIRONE HCL 15 MG PO TABS: 15.0000 mg | ORAL_TABLET | Freq: Two times a day (BID) | ORAL | 1 refills | Status: DC

## 2018-12-12 MED ORDER — AMPHETAMINE-DEXTROAMPHETAMINE 10 MG PO TABS: 10.0000 mg | ORAL_TABLET | Freq: Every day | ORAL | 0 refills | Status: DC

## 2018-12-12 MED ORDER — LAMOTRIGINE 150 MG PO TABS: 150.0000 mg | ORAL_TABLET | Freq: Every day | ORAL | 1 refills | Status: DC

## 2018-12-12 NOTE — Addendum Note (Signed)
Addended by: Derenda Mis on: 12/12/2018 01:34 PM   Modules accepted: Orders

## 2018-12-12 NOTE — Telephone Encounter (Signed)
Patient was 27 mins. Late for appt rs to 03/27 need refills on all meds

## 2018-12-12 NOTE — Telephone Encounter (Signed)
Refills sent for all meds

## 2018-12-28 ENCOUNTER — Ambulatory Visit: Payer: BLUE CROSS/BLUE SHIELD | Admitting: Psychiatry

## 2019-01-15 ENCOUNTER — Ambulatory Visit: Payer: BLUE CROSS/BLUE SHIELD | Admitting: Psychiatry

## 2019-02-01 ENCOUNTER — Other Ambulatory Visit: Payer: Self-pay

## 2019-02-01 ENCOUNTER — Telehealth: Payer: Self-pay | Admitting: Psychiatry

## 2019-02-01 DIAGNOSIS — F900 Attention-deficit hyperactivity disorder, predominantly inattentive type: Secondary | ICD-10-CM

## 2019-02-01 MED ORDER — LISDEXAMFETAMINE DIMESYLATE 30 MG PO CAPS: 30.0000 mg | ORAL_CAPSULE | Freq: Every day | ORAL | 0 refills | Status: DC

## 2019-02-01 NOTE — Telephone Encounter (Signed)
Lynn Cole called to request refill of her Vyvanse.  Next appt 02/18/19.  Send to [facility] [place]

## 2019-02-01 NOTE — Telephone Encounter (Signed)
Pended for provider

## 2019-02-18 ENCOUNTER — Ambulatory Visit (INDEPENDENT_AMBULATORY_CARE_PROVIDER_SITE_OTHER): Payer: BLUE CROSS/BLUE SHIELD | Admitting: Psychiatry

## 2019-02-18 ENCOUNTER — Other Ambulatory Visit: Payer: Self-pay

## 2019-02-18 ENCOUNTER — Encounter: Payer: Self-pay | Admitting: Psychiatry

## 2019-02-18 DIAGNOSIS — F39 Unspecified mood [affective] disorder: Secondary | ICD-10-CM

## 2019-02-18 DIAGNOSIS — F900 Attention-deficit hyperactivity disorder, predominantly inattentive type: Secondary | ICD-10-CM | POA: Diagnosis not present

## 2019-02-18 DIAGNOSIS — F411 Generalized anxiety disorder: Secondary | ICD-10-CM

## 2019-02-18 MED ORDER — LAMOTRIGINE 100 MG PO TABS: ORAL_TABLET | ORAL | 0 refills | Status: DC

## 2019-02-18 MED ORDER — AMPHETAMINE-DEXTROAMPHETAMINE 10 MG PO TABS: 10.0000 mg | ORAL_TABLET | Freq: Every day | ORAL | 0 refills | Status: DC

## 2019-02-18 MED ORDER — LURASIDONE HCL 40 MG PO TABS: ORAL_TABLET | ORAL | 0 refills | Status: DC

## 2019-02-18 MED ORDER — LISDEXAMFETAMINE DIMESYLATE 30 MG PO CAPS: 30.0000 mg | ORAL_CAPSULE | Freq: Every day | ORAL | 0 refills | Status: DC

## 2019-02-18 MED ORDER — BUSPIRONE HCL 15 MG PO TABS: 15.0000 mg | ORAL_TABLET | Freq: Two times a day (BID) | ORAL | 1 refills | Status: DC

## 2019-02-18 NOTE — Progress Notes (Signed)
Lynn Cole [id number] 21-Aug-1991 28 y.o.  Virtual Visit via Telephone Note  I connected with pt on 02/18/19 at 10:30 AM EDT by telephone and verified that I am speaking with the correct person using two identifiers.   I discussed the limitations, risks, security and privacy concerns of performing an evaluation and management service by telephone and the availability of in person appointments. I also discussed with the patient that there may be a patient responsible charge related to this service. The patient expressed understanding and agreed to proceed.   I discussed the assessment and treatment plan with the patient. The patient was provided an opportunity to ask questions and all were answered. The patient agreed with the plan and demonstrated an understanding of the instructions.   The patient was advised to call back or seek an in-person evaluation if the symptoms worsen or if the condition fails to improve as anticipated.  I provided 30 minutes of non-face-to-face time during this encounter.  The patient was located at home.  The provider was located at home.   Corie Chiquito, PMHNP   Subjective:   Patient ID:  Lynn Cole is a 28 y.o. (DOB [birth year]) female.  Chief Complaint:  Chief Complaint  Patient presents with  . Depression    HPI Lynn Cole presents for follow-up of mood, anxiety, and ADD. She reports that her mood has been "good" with some ups and downs. Reports that she has had some occ depressive s/s and hypomanic s/s (rapid thinking, elevated mood, decreased sleep, increased energy) for several days in duration. Reports that she had some recent hypomanic s/s and now experiencing more depressive s/s. She c/o "feeling like something is wrong with my head... a heavy feeling... maybe a concussion?... or allergies?" She reports that she had some throbbing pain around her right temple x 15-30 minutes and that she took ibuprofen for this. Reports that she has hit  her head on shelf but does not correlate head s/s with head injury. Reports that she had some brief nausea 1-2 days. Reports that she has had similar feeling of lethargy in the past with depression. She reports that she was easily fatigued recently while running. Reports that she has been isolated recently with pandemic.   Reports that she will have 1-2 week period of depression with periods of hypomania lasting days to weeks. Reports that she has periods of euthymia. Mood has been more depressed in the past week. Reports persistent sad mood for the past week. Denies irritability. Reports that her sleep schedule was improving and recently has reverted to going to bed late and sleeping late. Estimates sleeping about 9 hours a night. She reports that energy and motivation have been lower the last few days. Appetite has been stable. Describes concentration as "ok." Denies SI.  Denies any recent ETOH use.   Able to work from home during pandemic. Has been able to see therapist remotely.   Review of Systems:  Review of Systems  Constitutional: Negative for fever.  Respiratory:       Reports "some shortness of breath, but not really."  Cardiovascular: Positive for palpitations.  Gastrointestinal: Positive for nausea.  Musculoskeletal: Negative for gait problem.  Neurological: Positive for dizziness, light-headedness and headaches. Negative for tremors.  Psychiatric/Behavioral:       Please refer to HPI    Medications: I have reviewed the patient's current medications.  Current Outpatient Medications  Medication Sig Dispense Refill  . amphetamine-dextroamphetamine (ADDERALL) 10 MG tablet Take 1 tablet (10  mg total) by mouth daily. 30 tablet 0  . BIOTIN PO Take by mouth.    . busPIRone (BUSPAR) 15 MG tablet Take 1 tablet (15 mg total) by mouth 2 (two) times daily. 180 tablet 1  . COLLAGEN PO Take by mouth.    . docusate sodium (COLACE) 100 MG capsule Take 100 mg by mouth 2 (two) times daily.     Marland Kitchen lamoTRIgine (LAMICTAL) 150 MG tablet Take 1 tablet (150 mg total) by mouth daily. 90 tablet 1  . [START ON 03/01/2019] lisdexamfetamine (VYVANSE) 30 MG capsule Take 1 capsule (30 mg total) by mouth daily for 30 days. 30 capsule 0  . MELATONIN PO Take 1 tablet by mouth at bedtime as needed (sleep).    . naproxen sodium (ALEVE) 220 MG tablet Take 220 mg by mouth daily as needed (pain).    . Probiotic Product (ALIGN PO) Take by mouth.    . zinc gluconate 50 MG tablet Take 50 mg by mouth daily.    Marland Kitchen amphetamine-dextroamphetamine (ADDERALL) 10 MG tablet Take 1 tablet (10 mg total) by mouth daily with breakfast. 30 tablet 0  . amphetamine-dextroamphetamine (ADDERALL) 10 MG tablet Take 1 tablet (10 mg total) by mouth daily with breakfast. 30 tablet 0  . cetirizine (ZYRTEC) 10 MG tablet Take 10 mg by mouth daily.    . fluticasone (FLONASE) 50 MCG/ACT nasal spray Place into both nostrils daily.    Marland Kitchen lamoTRIgine (LAMICTAL) 100 MG tablet Take 1 tab po qd x 10 days, then 1/2 tab po qd x 10 days, then stop 30 tablet 0  . lisdexamfetamine (VYVANSE) 30 MG capsule Take 1 capsule (30 mg total) by mouth daily. 30 capsule 0  . lisdexamfetamine (VYVANSE) 30 MG capsule Take 1 capsule (30 mg total) by mouth daily for 30 days. 30 capsule 0  . lurasidone (LATUDA) 40 MG TABS tablet Take 20 mg daily with evening meal x 1 week, then increase to 40 mg po qd. 30 tablet 0   No current facility-administered medications for this visit.     Medication Side Effects: None  Allergies: No Known Allergies  Past Medical History:  Diagnosis Date  . IBS (irritable bowel syndrome)     Family History  Problem Relation Age of Onset  . Alcohol abuse Father   . Migraines Sister     Social History   Socioeconomic History  . Marital status: Single    Spouse name: Not on file  . Number of children: 0  . Years of education: Not on file  . Highest education level: Master's degree (e.g., MA, MS, MEng, MEd, MSW, MBA)   Occupational History  . Occupation: Risk analyst  Social Needs  . Financial resource strain: Not on file  . Food insecurity:    Worry: Not on file    Inability: Not on file  . Transportation needs:    Medical: Not on file    Non-medical: Not on file  Tobacco Use  . Smoking status: Never Smoker  . Smokeless tobacco: Never Used  Substance and Sexual Activity  . Alcohol use: Yes  . Drug use: Yes    Types: Cocaine  . Sexual activity: Not on file  Lifestyle  . Physical activity:    Days per week: Not on file    Minutes per session: Not on file  . Stress: Not on file  Relationships  . Social connections:    Talks on phone: Not on file    Gets together: Not on  file    Attends religious service: Not on file    Active member of club or organization: Not on file    Attends meetings of clubs or organizations: Not on file    Relationship status: Not on file  . Intimate partner violence:    Fear of current or ex partner: Not on file    Emotionally abused: Not on file    Physically abused: Not on file    Forced sexual activity: Not on file  Other Topics Concern  . Not on file  Social History Narrative   Patient is right-handed. She lives alone in a 2nd floor apartment. She drinks 1-3 cups of coffee a day. She runs 4-5 miles most days.    Past Medical History, Surgical history, Social history, and Family history were reviewed and updated as appropriate.   Please see review of systems for further details on the patient's review from today.   Objective:   Physical Exam:  There were no vitals taken for this visit.  Physical Exam Neurological:     Mental Status: She is alert and oriented to person, place, and time.     Cranial Nerves: No dysarthria.  Psychiatric:        Attention and Perception: She is inattentive.        Mood and Affect: Mood is depressed.        Speech: Speech is delayed and slurred.        Behavior: Behavior is cooperative.        Thought Content:  Thought content normal. Thought content is not paranoid or delusional. Thought content does not include homicidal or suicidal ideation. Thought content does not include homicidal or suicidal plan.        Cognition and Memory: Cognition and memory normal.        Judgment: Judgment normal.     Lab Review:     Component Value Date/Time   NA 141 06/06/2018 0353   K 3.5 06/06/2018 0353   CL 103 06/06/2018 0353   CO2 27 06/06/2018 0353   GLUCOSE 91 06/06/2018 0353   BUN 11 06/06/2018 0353   CREATININE 0.79 06/06/2018 0353   CALCIUM 9.5 06/06/2018 0353   PROT 6.3 (L) 06/06/2018 0353   ALBUMIN 4.4 06/06/2018 0353   AST 32 06/06/2018 0353   ALT 20 06/06/2018 0353   ALKPHOS 54 06/06/2018 0353   BILITOT 0.6 06/06/2018 0353   GFRNONAA >60 06/06/2018 0353   GFRAA >60 06/06/2018 0353       Component Value Date/Time   WBC 7.3 06/06/2018 0353   RBC 3.84 (L) 06/06/2018 0353   HGB 12.4 06/06/2018 0353   HCT 36.3 06/06/2018 0353   PLT 230 06/06/2018 0353   MCV 94.5 06/06/2018 0353   MCH 32.3 06/06/2018 0353   MCHC 34.2 06/06/2018 0353   RDW 12.8 06/06/2018 0353   LYMPHSABS 2.8 06/06/2018 0353   MONOABS 0.6 06/06/2018 0353   EOSABS 0.3 06/06/2018 0353   BASOSABS 0.0 06/06/2018 0353    No results found for: POCLITH, LITHIUM   No results found for: PHENYTOIN, PHENOBARB, VALPROATE, CBMZ   .res Assessment: Plan:   Recommended patient f/u with PCP if pain in head persists and/or she develops any additional signs and symptoms. Discussed treatment plan with patient and she reports that lamotrigine does not seem to be effective for her and would like to come off of lamotrigine.  Discussed gradually decreasing dose to minimize any potential risk of seizure.  Will decrease  lamotrigine to 100 mg daily for 10 days, then 50 mg p.o. daily x10 days, then stop. Discussed potential benefits, risks, and side effects of Latuda. Discussed potential metabolic side effects associated with atypical  antipsychotics, as well as potential risk for movement side effects. Advised pt to contact office if movement side effects occur.  Will start Latuda 20 mg daily with evening meal for 1 week, then increase to 40 mg daily for mood signs and symptoms. Will continue Vyvanse 30 mg p.o. every morning for attention deficit. Continue Adderall 10 mg daily for attention deficit. Continue BuSpar 15 mg twice daily for anxiety. Patient to follow-up in 4 weeks or sooner if clinically indicated. Patient advised to contact office with any questions, adverse effects, or acute worsening in signs and symptoms.  Mood disorder (HCC) - Plan: lamoTRIgine (LAMICTAL) 100 MG tablet, lurasidone (LATUDA) 40 MG TABS tablet  Attention deficit hyperactivity disorder (ADHD), predominantly inattentive type - Stable - Plan: lisdexamfetamine (VYVANSE) 30 MG capsule, amphetamine-dextroamphetamine (ADDERALL) 10 MG tablet  Generalized anxiety disorder - Stable - Plan: busPIRone (BUSPAR) 15 MG tablet  Please see After Visit Summary for patient specific instructions.  Future Appointments  Date Time Provider Department Center  03/18/2019 10:30 AM Corie Chiquitoarter, Drucella Karbowski, PMHNP [facility] None    No orders of the defined types were placed in this encounter.     -------------------------------

## 2019-02-18 NOTE — Patient Instructions (Addendum)
Start Latuda 20 mg daily with evening meal for one week, then increase to Latuda 40 mg daily with evening meal.   Decrease Lamotrigine (Lamictal) to 100 mg daily for 10 days, then decrease to 50 mg daily for 10 days, then stop.   Continue all other medications as prescribed

## 2019-02-26 ENCOUNTER — Telehealth: Payer: Self-pay | Admitting: Psychiatry

## 2019-02-26 NOTE — Telephone Encounter (Signed)
Left voicemail to call back with information 

## 2019-02-26 NOTE — Telephone Encounter (Signed)
Patient called and has a question about her medicine. Please give her a call back at [phone]

## 2019-03-18 ENCOUNTER — Encounter: Payer: Self-pay | Admitting: Psychiatry

## 2019-03-18 ENCOUNTER — Ambulatory Visit: Payer: BLUE CROSS/BLUE SHIELD | Admitting: Psychiatry

## 2019-03-18 ENCOUNTER — Other Ambulatory Visit: Payer: Self-pay

## 2019-03-18 DIAGNOSIS — F900 Attention-deficit hyperactivity disorder, predominantly inattentive type: Secondary | ICD-10-CM

## 2019-03-18 DIAGNOSIS — F39 Unspecified mood [affective] disorder: Secondary | ICD-10-CM

## 2019-03-18 MED ORDER — LURASIDONE HCL 40 MG PO TABS: 40.0000 mg | ORAL_TABLET | Freq: Every day | ORAL | 1 refills | Status: DC

## 2019-03-18 MED ORDER — AMPHETAMINE-DEXTROAMPHETAMINE 10 MG PO TABS: 10.0000 mg | ORAL_TABLET | Freq: Every day | ORAL | 0 refills | Status: DC

## 2019-03-18 MED ORDER — LISDEXAMFETAMINE DIMESYLATE 30 MG PO CAPS: 30.0000 mg | ORAL_CAPSULE | Freq: Every day | ORAL | 0 refills | Status: DC

## 2019-03-18 NOTE — Progress Notes (Addendum)
Lynn Cole [id number] [birth year] 28 y.o.  Subjective:   Patient ID:  Lynn Cole is a 28 y.o. (DOB [birth year]) female.  Chief Complaint:  Chief Complaint  Patient presents with  . Follow-up    h/o Depression, Anxiety, Insomnia, ADD    HPI Lynn Cole presents to the office today for follow-up of mood, anxiety, and ADD. Lynn Cole was started last month for mood s/s. She reports, "I have felt better. I have had a lot less suicidal ideation." She reports that she has noticed irritability has resolved since taking Latuda. She reports "the low feel a lot less extreme." She reports that prior to start of Lynn Cole she was having severe depressive episodes. Reports that she has had less mood lability on Latuda. She reports that she continues to have some periods of mild depression and also hypomania and that the severity of these mood episodes has decreased. She reports that she is having less difficulty with sleep initiation on Latuda. Now sleeping 8 hours a night. Appetite has been "the same." She reports that anxiety has been manageable. She reports that concentration has improved since working with therapist re: ADD management strategies. Reports that she continues to have some difficulty with concentration. She reports that energy and motivation have been good. She reports that she is having suicidal thoughts "very rarely." Denies suicidal intent or plan.    Review of Systems:  Review of Systems  Cardiovascular: Negative for palpitations.  Gastrointestinal:       Nausea on rare occasions.   Musculoskeletal: Negative for gait problem.  Neurological: Negative for tremors.  Psychiatric/Behavioral:       Please refer to HPI    Medications: I have reviewed the patient's current medications.  Current Outpatient Medications  Medication Sig Dispense Refill  . amphetamine-dextroamphetamine (ADDERALL) 10 MG tablet Take 1 tablet (10 mg total) by mouth daily. 30 tablet 0  . BIOTIN PO Take by  mouth.    . busPIRone (BUSPAR) 15 MG tablet Take 1 tablet (15 mg total) by mouth 2 (two) times daily. 180 tablet 1  . Calcium Polycarbophil (FIBER-CAPS PO) Take by mouth.    . cetirizine (ZYRTEC) 10 MG tablet Take 10 mg by mouth daily.    . COLLAGEN PO Take by mouth.    . docusate sodium (COLACE) 100 MG capsule Take 100 mg by mouth 2 (two) times daily.    . fluticasone (FLONASE) 50 MCG/ACT nasal spray Place into both nostrils daily.    Marland Kitchen lisdexamfetamine (VYVANSE) 30 MG capsule Take 1 capsule (30 mg total) by mouth daily for 30 days. 30 capsule 0  . lurasidone (LATUDA) 40 MG TABS tablet Take 1 tablet (40 mg total) by mouth daily with supper for 30 days. 30 tablet 1  . MELATONIN PO Take 1 tablet by mouth at bedtime as needed (sleep).    . naproxen sodium (ALEVE) 220 MG tablet Take 220 mg by mouth daily as needed (pain).    . Probiotic Product (ALIGN PO) Take by mouth.    . zinc gluconate 50 MG tablet Take 50 mg by mouth daily.    Marland Kitchen amphetamine-dextroamphetamine (ADDERALL) 10 MG tablet Take 1 tablet (10 mg total) by mouth daily with breakfast for 30 days. 30 tablet 0  . [START ON 04/15/2019] amphetamine-dextroamphetamine (ADDERALL) 10 MG tablet Take 1 tablet (10 mg total) by mouth daily with breakfast for 30 days. 30 tablet 0  . lisdexamfetamine (VYVANSE) 30 MG capsule Take 1 capsule (30 mg total) by mouth daily for 30  days. 30 capsule 0  . [START ON 04/01/2019] lisdexamfetamine (VYVANSE) 30 MG capsule Take 1 capsule (30 mg total) by mouth daily for 30 days. 30 capsule 0   No current facility-administered medications for this visit.     Medication Side Effects: None  Allergies: No Known Allergies  Past Medical History:  Diagnosis Date  . IBS (irritable bowel syndrome)     Family History  Problem Relation Age of Onset  . Alcohol abuse Father   . Migraines Sister     Social History   Socioeconomic History  . Marital status: Single    Spouse name: Not on file  . Number of children:  0  . Years of education: Not on file  . Highest education level: Master's degree (e.g., MA, MS, MEng, MEd, MSW, MBA)  Occupational History  . 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 Financial resource strain: Not on file  . Food insecurity    Worry: Not on file    Inability: Not on file  . Transportation needs    Medical: Not on file    Non-medical: Not on file  Tobacco Use  . Smoking status: Never Smoker  . Smokeless tobacco: Never Used  Substance and Sexual Activity  . Alcohol use: Yes  . Drug use: Yes    Types: Cocaine  . Sexual activity: Not on file  Lifestyle  . Physical activity    Days per week: Not on file    Minutes per session: Not on file  . Stress: Not on file  Relationships  . Social Musicianconnections    Talks on phone: Not on file    Gets together: Not on file    Attends religious service: Not on file    Active member of club or organization: Not on file    Attends meetings of clubs or organizations: Not on file    Relationship status: Not on file  . Intimate partner violence    Fear of current or ex partner: Not on file    Emotionally abused: Not on file    Physically abused: Not on file    Forced sexual activity: Not on file  Other Topics Concern  . Not on file  Social History Narrative   Patient is right-handed. She lives alone in a 2nd floor apartment. She drinks 1-3 cups of coffee a day. She runs 4-5 miles most days.    Past Medical History, Surgical history, Social history, and Family history were reviewed and updated as appropriate.   Please see review of systems for further details on the patient's review from today.   Objective:   Physical Exam:  BP 93/67   Pulse 69   Physical Exam Constitutional:      General: She is not in acute distress.    Appearance: She is well-developed.  Musculoskeletal:        General: No deformity.  Neurological:     Mental Status: She is alert and oriented to person, place, and time.     Coordination:  Coordination normal.  Psychiatric:        Attention and Perception: Attention and perception normal. She does not perceive auditory or visual hallucinations.        Mood and Affect: Mood is not anxious. Affect is not labile, blunt, angry or inappropriate.        Speech: Speech normal.        Behavior: Behavior normal.        Thought Content: Thought content normal.  Thought content does not include homicidal or suicidal ideation. Thought content does not include homicidal or suicidal plan.        Cognition and Memory: Cognition and memory normal.        Judgment: Judgment normal.     Comments: Insight intact. No delusions.  Pt presents as less depressed compared to last exam.      Lab Review:     Component Value Date/Time   NA 141 06/06/2018 0353   K 3.5 06/06/2018 0353   CL 103 06/06/2018 0353   CO2 27 06/06/2018 0353   GLUCOSE 91 06/06/2018 0353   BUN 11 06/06/2018 0353   CREATININE 0.79 06/06/2018 0353   CALCIUM 9.5 06/06/2018 0353   PROT 6.3 (L) 06/06/2018 0353   ALBUMIN 4.4 06/06/2018 0353   AST 32 06/06/2018 0353   ALT 20 06/06/2018 0353   ALKPHOS 54 06/06/2018 0353   BILITOT 0.6 06/06/2018 0353   GFRNONAA >60 06/06/2018 0353   GFRAA >60 06/06/2018 0353       Component Value Date/Time   WBC 7.3 06/06/2018 0353   RBC 3.84 (L) 06/06/2018 0353   HGB 12.4 06/06/2018 0353   HCT 36.3 06/06/2018 0353   PLT 230 06/06/2018 0353   MCV 94.5 06/06/2018 0353   MCH 32.3 06/06/2018 0353   MCHC 34.2 06/06/2018 0353   RDW 12.8 06/06/2018 0353   LYMPHSABS 2.8 06/06/2018 0353   MONOABS 0.6 06/06/2018 0353   EOSABS 0.3 06/06/2018 0353   BASOSABS 0.0 06/06/2018 0353    No results found for: POCLITH, LITHIUM   No results found for: PHENYTOIN, PHENOBARB, VALPROATE, CBMZ   .res Assessment: Plan:   Reviewed potential benefits, risks, and side effects of Latuda and answered pt's questions re: Latuda. Pt reports that she would like to continue Latuda since she has noticed some  improvement in mood s/s and would like more time to determine response since there have also been significant changes and other possible factors affecting her mood. Discussed that a script for Latuda would be sent to her pharmacy and a PA may be required. Pt provided with additional samples in case there is a delay with being able to fill script.   Renay was seen today for follow-up.  Diagnoses and all orders for this visit:  Mood disorder (HCC) -     lurasidone (LATUDA) 40 MG TABS tablet; Take 1 tablet (40 mg total) by mouth daily with supper for 30 days.  Attention deficit hyperactivity disorder (ADHD), predominantly inattentive type Comments: Stable Orders: -     amphetamine-dextroamphetamine (ADDERALL) 10 MG tablet; Take 1 tablet (10 mg total) by mouth daily with breakfast for 30 days. -     amphetamine-dextroamphetamine (ADDERALL) 10 MG tablet; Take 1 tablet (10 mg total) by mouth daily with breakfast for 30 days. -     lisdexamfetamine (VYVANSE) 30 MG capsule; Take 1 capsule (30 mg total) by mouth daily for 30 days.     Please see After Visit Summary for patient specific instructions.  Future Appointments  Date Time Provider Department Center  04/15/2019 10:00 AM Corie Chiquitoarter, Offie Waide, PMHNP CP-[facility] None    No orders of the defined types were placed in this encounter.   -------------------------------

## 2019-03-27 ENCOUNTER — Telehealth: Payer: Self-pay | Admitting: Psychiatry

## 2019-03-27 NOTE — Telephone Encounter (Signed)
Lynn Cole called and said that her insurance denied her Latuda.  She would like to discuss what to do about this.  Traci perhaps you've received a PA for this? She also said the pharmacy told her they didn't have a refill for her Vyvanse, but I called them and they do have the one scheduled for 04/01/19.  Please call you about the Latuda and advice her that the pharmacy does have the Vyvanse prescrption.  Next appt 7/13

## 2019-03-27 NOTE — Telephone Encounter (Signed)
Prior authorization submitted for latuda 40 mg 1 per day through [facility] effective 03/27/2019-03/25/2022

## 2019-03-27 NOTE — Telephone Encounter (Signed)
No PA request received yet but I will go ahead and process one for her Latuda. We do have samples in the closet if she needs them.

## 2019-03-27 NOTE — Telephone Encounter (Signed)
PA submitted through [facility] and approved for latuda 40 mg.

## 2019-03-28 NOTE — Telephone Encounter (Signed)
Pt.notified

## 2019-04-01 ENCOUNTER — Telehealth: Payer: Self-pay | Admitting: Psychiatry

## 2019-04-01 NOTE — Telephone Encounter (Signed)
Keerat called to say she is almost out of Latuda 40mg  samples so went to pharmacy to pick up the prescription and the cost with insurance is $440.  She has used the discount card yet.  She will get one and pick up more samples until she can see if we can get the cost down.  If the discount card doesn't help, is there a PA or something we need to do with the insurance.  Also, she had another prescription from another dr. Durene Cal to [facility] in [place].  They have financial assistance programs and she got her other med for free.  Would you send in a prescription to them to see if that works for the [place] as well. [phone]

## 2019-04-15 ENCOUNTER — Other Ambulatory Visit: Payer: Self-pay

## 2019-04-15 ENCOUNTER — Ambulatory Visit (INDEPENDENT_AMBULATORY_CARE_PROVIDER_SITE_OTHER): Payer: BC Managed Care – PPO | Admitting: Psychiatry

## 2019-04-15 DIAGNOSIS — R69 Illness, unspecified: Secondary | ICD-10-CM

## 2019-04-15 NOTE — Progress Notes (Signed)
Attempted to reach pt x 3 for requested telephone visit. No answer. Left 2 messages. Recommended she contact office to re-schedule.

## 2019-04-19 NOTE — Telephone Encounter (Signed)
error 

## 2019-04-29 ENCOUNTER — Telehealth: Payer: Self-pay | Admitting: Psychiatry

## 2019-04-29 NOTE — Telephone Encounter (Signed)
Patient called and said that pharmacy says that her latuda can't be refilled due to not being coveered by insurance would like to have samples of 40 mg latuda and will come pick up around 10 am. Is this ok?

## 2019-04-30 NOTE — Telephone Encounter (Signed)
Prior Authorization approved on 03/27/2019, will contact pharmacy for clarification on why pt can't receive.

## 2019-04-30 NOTE — Telephone Encounter (Signed)
Spoke with pharmacy this morning they ran the Rx and it was approved today co-pay is $25.00. Pharmacist said yesterday it wouldn't go through, possibly an early refill.

## 2019-05-06 ENCOUNTER — Other Ambulatory Visit: Payer: Self-pay

## 2019-05-06 ENCOUNTER — Telehealth: Payer: Self-pay | Admitting: Psychiatry

## 2019-05-06 DIAGNOSIS — F900 Attention-deficit hyperactivity disorder, predominantly inattentive type: Secondary | ICD-10-CM

## 2019-05-06 MED ORDER — LISDEXAMFETAMINE DIMESYLATE 30 MG PO CAPS: 30.0000 mg | ORAL_CAPSULE | Freq: Every day | ORAL | 0 refills | Status: DC

## 2019-05-06 NOTE — Telephone Encounter (Signed)
Pended for approval, last fill 04/01/2019

## 2019-05-06 NOTE — Telephone Encounter (Signed)
Latiqua called to request refill of her Vyvanse 30 mg.  Appt 05/13/19.  Send to [facility] [address] [place]

## 2019-05-13 ENCOUNTER — Ambulatory Visit: Payer: BC Managed Care – PPO | Admitting: Psychiatry

## 2019-05-14 ENCOUNTER — Ambulatory Visit (INDEPENDENT_AMBULATORY_CARE_PROVIDER_SITE_OTHER): Payer: BC Managed Care – PPO | Admitting: Psychiatry

## 2019-05-14 ENCOUNTER — Encounter: Payer: Self-pay | Admitting: Psychiatry

## 2019-05-14 ENCOUNTER — Other Ambulatory Visit: Payer: Self-pay

## 2019-05-14 VITALS — BP 101/70 | HR 70

## 2019-05-14 DIAGNOSIS — F39 Unspecified mood [affective] disorder: Secondary | ICD-10-CM

## 2019-05-14 DIAGNOSIS — F411 Generalized anxiety disorder: Secondary | ICD-10-CM

## 2019-05-14 DIAGNOSIS — F900 Attention-deficit hyperactivity disorder, predominantly inattentive type: Secondary | ICD-10-CM

## 2019-05-14 MED ORDER — PILOCARPINE HCL 5 MG PO TABS: 5.0000 mg | ORAL_TABLET | Freq: Every day | ORAL | 2 refills | Status: DC | PRN

## 2019-05-14 MED ORDER — LURASIDONE HCL 80 MG PO TABS: ORAL_TABLET | ORAL | 2 refills | Status: DC

## 2019-05-14 MED ORDER — AMPHETAMINE-DEXTROAMPHETAMINE 10 MG PO TABS: 10.0000 mg | ORAL_TABLET | Freq: Every day | ORAL | 0 refills | Status: DC

## 2019-05-14 MED ORDER — LISDEXAMFETAMINE DIMESYLATE 30 MG PO CAPS: 30.0000 mg | ORAL_CAPSULE | Freq: Every day | ORAL | 0 refills | Status: DC

## 2019-05-14 MED ORDER — BUSPIRONE HCL 15 MG PO TABS: 15.0000 mg | ORAL_TABLET | Freq: Two times a day (BID) | ORAL | 1 refills | Status: DC

## 2019-05-14 NOTE — Progress Notes (Signed)
Lynn Cole [id number] Apr 23, 1991 28 y.o.  Subjective:   Patient ID:  Lynn Cole is a 28 y.o. (DOB [birth year]) female.  Chief Complaint:  Chief Complaint  Patient presents with  . Follow-up    h/o mood disturbance, anxiety, ADD    HPI Lynn Cole presents to the office today for follow-up of mood s/s, anxiety, and ADD. She reports that she initially had difficulty getting Latuda covered. Reports that copay continues to be $40 with copay card. Feels that Anette Guarneri has been helpful for her mood s/s. She has noticed some mild depressive s/s with some sadness and lower energy and motivation. Reports that lower mood may be environmental. Denies any "extremes" in mood. Denies any periods of elevated mood. Denies impulsivity or risky behavior. Denies significant anxiety. She reports that she has been sleeping well overall and sleep has been more consistent. Appetite has been stable. Denies any recent binge eating. She reports adequate concentration. Denies SI.   Denies any recent ETOH use.   Has been seeing a therapist and reports that she has met most of her goals for tx.  Review of Systems:  Review of Systems  Musculoskeletal: Negative for gait problem.  Neurological: Negative for tremors.  Psychiatric/Behavioral:       Please refer to HPI    Medications: I have reviewed the patient's current medications.  Current Outpatient Medications  Medication Sig Dispense Refill  . [START ON 07/21/2019] amphetamine-dextroamphetamine (ADDERALL) 10 MG tablet Take 1 tablet (10 mg total) by mouth daily. 30 tablet 0  . [START ON 06/23/2019] amphetamine-dextroamphetamine (ADDERALL) 10 MG tablet Take 1 tablet (10 mg total) by mouth daily with breakfast. 30 tablet 0  . BIOTIN PO Take by mouth.    . busPIRone (BUSPAR) 15 MG tablet Take 1 tablet (15 mg total) by mouth 2 (two) times daily. 180 tablet 1  . Calcium Polycarbophil (FIBER-CAPS PO) Take by mouth.    . cetirizine (ZYRTEC) 10 MG tablet Take  10 mg by mouth daily.    . COLLAGEN PO Take by mouth.    . docusate sodium (COLACE) 100 MG capsule Take 100 mg by mouth 2 (two) times daily.    . fluticasone (FLONASE) 50 MCG/ACT nasal spray Place into both nostrils daily as needed.     Derrill Memo ON 07/29/2019] lisdexamfetamine (VYVANSE) 30 MG capsule Take 1 capsule (30 mg total) by mouth daily. 30 capsule 0  . MELATONIN PO Take 1 tablet by mouth at bedtime as needed (sleep).    . naproxen sodium (ALEVE) 220 MG tablet Take 220 mg by mouth daily as needed (pain).    . pilocarpine (SALAGEN) 5 MG tablet Take 1 tablet (5 mg total) by mouth daily as needed. 30 tablet 2  . Probiotic Product (ALIGN PO) Take by mouth.    Derrill Memo ON 05/26/2019] amphetamine-dextroamphetamine (ADDERALL) 10 MG tablet Take 1 tablet (10 mg total) by mouth daily with breakfast. 30 tablet 0  . [START ON 07/01/2019] lisdexamfetamine (VYVANSE) 30 MG capsule Take 1 capsule (30 mg total) by mouth daily. 30 capsule 0  . [START ON 06/03/2019] lisdexamfetamine (VYVANSE) 30 MG capsule Take 1 capsule (30 mg total) by mouth daily. 30 capsule 0  . lurasidone (LATUDA) 80 MG TABS tablet Take 1/2-1 tablet po qd with a meal 30 tablet 2   No current facility-administered medications for this visit.     Medication Side Effects: Other: reports occ "head heaviness" Reports severe dry mouth with Vistaril and has taken Pilocarpine. Requests refill of  Pilocarpine prn for days that she has meetings or occasions where dry mouth may be problematic.    Allergies: No Known Allergies  Past Medical History:  Diagnosis Date  . IBS (irritable bowel syndrome)     Family History  Problem Relation Age of Onset  . Alcohol abuse Father   . Migraines Sister     Social History   Socioeconomic History  . Marital status: Single    Spouse name: Not on file  . Number of children: 0  . Years of education: Not on file  . Highest education level: Master's degree (e.g., MA, MS, MEng, MEd, MSW, MBA)   Occupational History  . Occupation: Corporate treasurer  Social Needs  . Financial resource strain: Not on file  . Food insecurity    Worry: Not on file    Inability: Not on file  . Transportation needs    Medical: Not on file    Non-medical: Not on file  Tobacco Use  . Smoking status: Never Smoker  . Smokeless tobacco: Never Used  Substance and Sexual Activity  . Alcohol use: Yes  . Drug use: Yes    Types: Cocaine  . Sexual activity: Not on file  Lifestyle  . Physical activity    Days per week: Not on file    Minutes per session: Not on file  . Stress: Not on file  Relationships  . Social Herbalist on phone: Not on file    Gets together: Not on file    Attends religious service: Not on file    Active member of club or organization: Not on file    Attends meetings of clubs or organizations: Not on file    Relationship status: Not on file  . Intimate partner violence    Fear of current or ex partner: Not on file    Emotionally abused: Not on file    Physically abused: Not on file    Forced sexual activity: Not on file  Other Topics Concern  . Not on file  Social History Narrative   Patient is right-handed. She lives alone in a 2nd floor apartment. She drinks 1-3 cups of coffee a day. She runs 4-5 miles most days.    Past Medical History, Surgical history, Social history, and Family history were reviewed and updated as appropriate.   Please see review of systems for further details on the patient's review from today.   Objective:   Physical Exam:  BP 101/70   Pulse 70   Physical Exam  Lab Review:     Component Value Date/Time   NA 141 06/06/2018 0353   K 3.5 06/06/2018 0353   CL 103 06/06/2018 0353   CO2 27 06/06/2018 0353   GLUCOSE 91 06/06/2018 0353   BUN 11 06/06/2018 0353   CREATININE 0.79 06/06/2018 0353   CALCIUM 9.5 06/06/2018 0353   PROT 6.3 (L) 06/06/2018 0353   ALBUMIN 4.4 06/06/2018 0353   AST 32 06/06/2018 0353   ALT 20  06/06/2018 0353   ALKPHOS 54 06/06/2018 0353   BILITOT 0.6 06/06/2018 0353   GFRNONAA >60 06/06/2018 0353   GFRAA >60 06/06/2018 0353       Component Value Date/Time   WBC 7.3 06/06/2018 0353   RBC 3.84 (L) 06/06/2018 0353   HGB 12.4 06/06/2018 0353   HCT 36.3 06/06/2018 0353   PLT 230 06/06/2018 0353   MCV 94.5 06/06/2018 0353   MCH 32.3 06/06/2018 0353   MCHC 34.2  06/06/2018 0353   RDW 12.8 06/06/2018 0353   LYMPHSABS 2.8 06/06/2018 0353   MONOABS 0.6 06/06/2018 0353   EOSABS 0.3 06/06/2018 0353   BASOSABS 0.0 06/06/2018 0353    No results found for: POCLITH, LITHIUM   No results found for: PHENYTOIN, PHENOBARB, VALPROATE, CBMZ   .res Assessment: Plan:   Will change Latuda to 80 mg 1/2-1 tab p.o. daily due to cost. Will continue all other medications as prescribed since patient reports that mood, anxiety, and ADD signs and symptoms are well controlled and denies tolerability issues. Patient to follow-up in 3 months or sooner if clinically indicated. Patient advised to contact office with any questions, adverse effects, or acute worsening in signs and symptoms.  Soliyana was seen today for follow-up.  Diagnoses and all orders for this visit:  Mood disorder ([place]) -     lurasidone (LATUDA) 80 MG TABS tablet; Take 1/2-1 tablet po qd with a meal  Generalized anxiety disorder Comments: Stable  Orders: -     busPIRone (BUSPAR) 15 MG tablet; Take 1 tablet (15 mg total) by mouth 2 (two) times daily.  Attention deficit hyperactivity disorder (ADHD), predominantly inattentive type Comments: Stable Orders: -     amphetamine-dextroamphetamine (ADDERALL) 10 MG tablet; Take 1 tablet (10 mg total) by mouth daily. -     amphetamine-dextroamphetamine (ADDERALL) 10 MG tablet; Take 1 tablet (10 mg total) by mouth daily with breakfast. -     amphetamine-dextroamphetamine (ADDERALL) 10 MG tablet; Take 1 tablet (10 mg total) by mouth daily with breakfast. -     lisdexamfetamine  (VYVANSE) 30 MG capsule; Take 1 capsule (30 mg total) by mouth daily. -     lisdexamfetamine (VYVANSE) 30 MG capsule; Take 1 capsule (30 mg total) by mouth daily. -     lisdexamfetamine (VYVANSE) 30 MG capsule; Take 1 capsule (30 mg total) by mouth daily.  Other orders -     pilocarpine (SALAGEN) 5 MG tablet; Take 1 tablet (5 mg total) by mouth daily as needed.     Please see After Visit Summary for patient specific instructions.  Future Appointments  Date Time Provider [place]  08/14/2019  1:15 PM Thayer Headings, PMHNP CP-CP None    No orders of the defined types were placed in this encounter.   -------------------------------

## 2019-05-14 NOTE — Progress Notes (Signed)
   05/14/19 1533  Facial and Oral Movements  Muscles of Facial Expression 0  Lips and Perioral Area 0  Jaw 0  Tongue 0  Extremity Movements  Upper (arms, wrists, hands, fingers) 0  Lower (legs, knees, ankles, toes) 0  Trunk Movements  Neck, shoulders, hips 0  Overall Severity  Severity of abnormal movements (highest score from questions above) 0  Incapacitation due to abnormal movements 0  Patient's awareness of abnormal movements (rate only patient's report) 0  Dental Status  Current problems with teeth and/or dentures? No  Does patient usually wear dentures? No  AIMS Total Score  AIMS Total Score 0

## 2019-05-29 ENCOUNTER — Other Ambulatory Visit: Payer: Self-pay | Admitting: Psychiatry

## 2019-05-29 DIAGNOSIS — F39 Unspecified mood [affective] disorder: Secondary | ICD-10-CM

## 2019-06-24 ENCOUNTER — Other Ambulatory Visit: Payer: Self-pay | Admitting: Psychiatry

## 2019-06-24 DIAGNOSIS — F411 Generalized anxiety disorder: Secondary | ICD-10-CM

## 2019-06-24 NOTE — Telephone Encounter (Signed)
Didn;t see on her profile? 

## 2019-06-28 ENCOUNTER — Other Ambulatory Visit: Payer: Self-pay

## 2019-06-28 ENCOUNTER — Telehealth: Payer: Self-pay | Admitting: Psychiatry

## 2019-06-28 DIAGNOSIS — F39 Unspecified mood [affective] disorder: Secondary | ICD-10-CM

## 2019-06-28 MED ORDER — LURASIDONE HCL 20 MG PO TABS: ORAL_TABLET | ORAL | 1 refills | Status: DC

## 2019-06-28 NOTE — Telephone Encounter (Signed)
Patient aware and told her the PA would be done as soon as we could. Offered samples if needed. New RX submitted to pharmacy

## 2019-06-28 NOTE — Telephone Encounter (Signed)
Patient called and left a message about having a question about her medicine and wanting to decrease her medicine.Please give her a call at [phone]

## 2019-07-25 ENCOUNTER — Telehealth: Payer: Self-pay | Admitting: Psychiatry

## 2019-07-25 NOTE — Telephone Encounter (Signed)
Pt called to request refill for Latuda. Stated  Insurance denied 1& 1/2 = 30 mg. Ask for refill for 20 mg. May want to talk with patient

## 2019-07-25 NOTE — Telephone Encounter (Signed)
Prior authorization submitted for Latuda 20 mg 1.5 tablets daily, waiting on a response.

## 2019-07-26 ENCOUNTER — Telehealth: Payer: Self-pay

## 2019-07-26 NOTE — Telephone Encounter (Signed)
Prior authorization submitted and approved for Latuda 20 mg take 1.5 tablets daily through [employer] effective 07/25/2019-07/23/2022

## 2019-08-05 ENCOUNTER — Telehealth: Payer: Self-pay | Admitting: Psychiatry

## 2019-08-05 NOTE — Telephone Encounter (Signed)
Left voicemail with Latuda 20 mg 1.5 tablets daily and this has been approved through [employer] effective 07/25/2019 Instructed to call back if she had further questions or concerns

## 2019-08-05 NOTE — Telephone Encounter (Signed)
Pt Lynn Cole inquiring about the strength and directions on her Latuda. Stating is the verbiage stated so it will be approved. Please advise.

## 2019-08-08 NOTE — Telephone Encounter (Signed)
Nollie has called to inform you that her Anette Guarneri is going to be too expensive because it is #45 in a 30 day period her insurance increases the cost even though it is approved for coverage. She worked with Asa Lente to try to get their help but in the end insurance policy just overcharges for prescriptions that are more then 1/day.  So she wants to try 20mg  1/day and see how that does.  [facility] [address].

## 2019-08-13 ENCOUNTER — Other Ambulatory Visit: Payer: Self-pay | Admitting: Psychiatry

## 2019-08-14 ENCOUNTER — Ambulatory Visit (INDEPENDENT_AMBULATORY_CARE_PROVIDER_SITE_OTHER): Payer: BC Managed Care – PPO | Admitting: Psychiatry

## 2019-08-14 ENCOUNTER — Other Ambulatory Visit: Payer: Self-pay

## 2019-08-14 ENCOUNTER — Encounter: Payer: Self-pay | Admitting: Psychiatry

## 2019-08-14 DIAGNOSIS — F411 Generalized anxiety disorder: Secondary | ICD-10-CM

## 2019-08-14 DIAGNOSIS — F900 Attention-deficit hyperactivity disorder, predominantly inattentive type: Secondary | ICD-10-CM

## 2019-08-14 DIAGNOSIS — F39 Unspecified mood [affective] disorder: Secondary | ICD-10-CM | POA: Diagnosis not present

## 2019-08-14 MED ORDER — LATUDA 60 MG PO TABS: ORAL_TABLET | ORAL | 3 refills | Status: DC

## 2019-08-14 MED ORDER — LISDEXAMFETAMINE DIMESYLATE 30 MG PO CAPS: 30.0000 mg | ORAL_CAPSULE | Freq: Every day | ORAL | 0 refills | Status: DC

## 2019-08-14 MED ORDER — AMPHETAMINE-DEXTROAMPHETAMINE 10 MG PO TABS: 10.0000 mg | ORAL_TABLET | Freq: Every day | ORAL | 0 refills | Status: DC

## 2019-08-14 MED ORDER — AMPHETAMINE-DEXTROAMPHETAMINE 10 MG PO TABS: ORAL_TABLET | ORAL | 0 refills | Status: DC

## 2019-08-14 NOTE — Progress Notes (Signed)
Lynn Cole [id number] 06/16/1991 28 y.o.  Subjective:   Patient ID:  Lynn Cole is a 28 y.o. (DOB January 15, 1991) female.  Chief Complaint:  Chief Complaint  Patient presents with  . Follow-up    h/o Anxiety, Depression, ADD    HPI Lynn Cole presents to the office today for follow-up of ADD, Anxiety, and Bipolar D/O. She reports that she has had increased drowsiness in the morning upon awakening and thinks it may be related to [facility] and has improved with dose reduction to 30 mg po qd but has not completely resolved. Reports that she is unable to wake up early. Has been taking Latuda around 8 pm with her dinner. She reports improved sleep with better sleep quality and duration. She reports that her mood has been stable. Denies peristent depressed mood. Reports that any depressive s/s have been less than a day. She reports hypomanic s/s have significantly decreased and have been minimal. Denies irritability. Anxiety has been manageable. Energy has been ok. Motivation has also been ok. Appetite is stable. Denies binge eating. Denies impaired concentration. Reports having a few episodes of fleeting suicidal thoughts. Denies SI.  Has not had ETOH for almost one year. Denies substance use.   Review of Systems:  Review of Systems  Gastrointestinal: Negative.   Musculoskeletal: Negative for gait problem.  Neurological: Negative for tremors and headaches.  Psychiatric/Behavioral:       Please refer to HPI    Medications: I have reviewed the patient's current medications.  Current Outpatient Medications  Medication Sig Dispense Refill  . [START ON 10/09/2019] amphetamine-dextroamphetamine (ADDERALL) 10 MG tablet Take 1 tablet (10 mg total) by mouth daily. 30 tablet 0  . Calcium Polycarbophil (FIBER-CAPS PO) Take by mouth.    . cetirizine (ZYRTEC) 10 MG tablet Take 10 mg by mouth daily as needed.     . COLLAGEN PO Take by mouth.    . docusate sodium (COLACE) 100 MG capsule Take 100 mg  by mouth 2 (two) times daily.    . fluticasone (FLONASE) 50 MCG/ACT nasal spray Place into both nostrils daily as needed.     . hydrOXYzine (ATARAX/VISTARIL) 25 MG tablet TAKE 2-3 TABLETS (50-75 MG TOTAL) BY MOUTH AT BEDTIME AS NEEDED (SLEEP). 270 tablet 1  . [START ON 10/26/2019] lisdexamfetamine (VYVANSE) 30 MG capsule Take 1 capsule (30 mg total) by mouth daily. 30 capsule 0  . MELATONIN PO Take 1 tablet by mouth at bedtime as needed (sleep).    . naproxen sodium (ALEVE) 220 MG tablet Take 220 mg by mouth daily as needed (pain).    . pilocarpine (SALAGEN) 5 MG tablet TAKE 1 TABLET (5 MG TOTAL) BY MOUTH DAILY AS NEEDED. 90 tablet 0  . Probiotic Product (ALIGN PO) Take by mouth.    Marland Kitchen amphetamine-dextroamphetamine (ADDERALL) 10 MG tablet Take 1 tablet (10 mg total) by mouth daily with breakfast. 30 tablet 0  . [START ON 09/11/2019] amphetamine-dextroamphetamine (ADDERALL) 10 MG tablet Take 1 tablet (10 mg total) by mouth daily. 30 tablet 0  . amphetamine-dextroamphetamine (ADDERALL) 10 MG tablet Take 1/2-1 tab po qd 30 tablet 0  . BIOTIN PO Take by mouth.    . busPIRone (BUSPAR) 15 MG tablet Take 1 tablet (15 mg total) by mouth 2 (two) times daily. 180 tablet 1  . [START ON 09/28/2019] lisdexamfetamine (VYVANSE) 30 MG capsule Take 1 capsule (30 mg total) by mouth daily. 30 capsule 0  . [START ON 08/31/2019] lisdexamfetamine (VYVANSE) 30 MG capsule Take 1 capsule (  30 mg total) by mouth daily. 30 capsule 0  . Lurasidone HCl (LATUDA) 60 MG TABS Take 1/2-1 tab po qd with evening meal 30 tablet 3   No current facility-administered medications for this visit.     Medication Side Effects: Sedation  Allergies: No Known Allergies  Past Medical History:  Diagnosis Date  . IBS (irritable bowel syndrome)     Family History  Problem Relation Age of Onset  . Alcohol abuse Father   . Migraines Sister     Social History   Socioeconomic History  . Marital status: Single    Spouse name: Not on  file  . Number of children: 0  . Years of education: Not on file  . Highest education level: Master's degree (e.g., MA, MS, MEng, MEd, MSW, MBA)  Occupational History  . 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 Financial resource strain: Not on file  . Food insecurity    Worry: Not on file    Inability: Not on file  . Transportation needs    Medical: Not on file    Non-medical: Not on file  Tobacco Use  . Smoking status: Never Smoker  . Smokeless tobacco: Never Used  Substance and Sexual Activity  . Alcohol use: Yes  . Drug use: Yes    Types: Cocaine  . Sexual activity: Not on file  Lifestyle  . Physical activity    Days per week: Not on file    Minutes per session: Not on file  . Stress: Not on file  Relationships  . Social Musicianconnections    Talks on phone: Not on file    Gets together: Not on file    Attends religious service: Not on file    Active member of club or organization: Not on file    Attends meetings of clubs or organizations: Not on file    Relationship status: Not on file  . Intimate partner violence    Fear of current or ex partner: Not on file    Emotionally abused: Not on file    Physically abused: Not on file    Forced sexual activity: Not on file  Other Topics Concern  . Not on file  Social History Narrative   Patient is right-handed. She lives alone in a 2nd floor apartment. She drinks 1-3 cups of coffee a day. She runs 4-5 miles most days.    Past Medical History, Surgical history, Social history, and Family history were reviewed and updated as appropriate.   Please see review of systems for further details on the patient's review from today.   Objective:   Physical Exam:  There were no vitals taken for this visit.  Physical Exam Constitutional:      General: She is not in acute distress.    Appearance: She is well-developed.  Musculoskeletal:        General: No deformity.  Neurological:     Mental Status: She is alert and oriented  to person, place, and time.     Coordination: Coordination normal.  Psychiatric:        Attention and Perception: Attention and perception normal. She does not perceive auditory or visual hallucinations.        Mood and Affect: Mood normal. Mood is not anxious or depressed. Affect is not labile, blunt, angry or inappropriate.        Speech: Speech normal.        Behavior: Behavior normal.        Thought  Content: Thought content normal. Thought content does not include homicidal or suicidal ideation. Thought content does not include homicidal or suicidal plan.        Cognition and Memory: Cognition and memory normal.        Judgment: Judgment normal.     Comments: Insight intact. No delusions.      Lab Review:     Component Value Date/Time   NA 141 06/06/2018 0353   K 3.5 06/06/2018 0353   CL 103 06/06/2018 0353   CO2 27 06/06/2018 0353   GLUCOSE 91 06/06/2018 0353   BUN 11 06/06/2018 0353   CREATININE 0.79 06/06/2018 0353   CALCIUM 9.5 06/06/2018 0353   PROT 6.3 (L) 06/06/2018 0353   ALBUMIN 4.4 06/06/2018 0353   AST 32 06/06/2018 0353   ALT 20 06/06/2018 0353   ALKPHOS 54 06/06/2018 0353   BILITOT 0.6 06/06/2018 0353   GFRNONAA >60 06/06/2018 0353   GFRAA >60 06/06/2018 0353       Component Value Date/Time   WBC 7.3 06/06/2018 0353   RBC 3.84 (L) 06/06/2018 0353   HGB 12.4 06/06/2018 0353   HCT 36.3 06/06/2018 0353   PLT 230 06/06/2018 0353   MCV 94.5 06/06/2018 0353   MCH 32.3 06/06/2018 0353   MCHC 34.2 06/06/2018 0353   RDW 12.8 06/06/2018 0353   LYMPHSABS 2.8 06/06/2018 0353   MONOABS 0.6 06/06/2018 0353   EOSABS 0.3 06/06/2018 0353   BASOSABS 0.0 06/06/2018 0353    No results found for: POCLITH, LITHIUM   No results found for: PHENYTOIN, PHENOBARB, VALPROATE, CBMZ   .res Assessment: Plan:   Will continue Latuda 60 mg 1/2-1 tab po qd for mood stabilization since pt was unable to tolerate 40 mg po qd due to excessive somnolence. Continue Vyvanse and  Adderall for ADHD. Continue Buspar 15 mg po BID for anxiety. Continue Hydroxyzine 25 mg po QHS prn. Pt to f/u in 3 months or sooner if clinically indicated. Patient advised to contact office with any questions, adverse effects, or acute worsening in signs and symptoms.  Daleah was seen today for follow-up.  Diagnoses and all orders for this visit:  Generalized anxiety disorder  Mood disorder (HCC) -     Lurasidone HCl (LATUDA) 60 MG TABS; Take 1/2-1 tab po qd with evening meal  Attention deficit hyperactivity disorder (ADHD), predominantly inattentive type Comments: Stable Orders: -     amphetamine-dextroamphetamine (ADDERALL) 10 MG tablet; Take 1 tablet (10 mg total) by mouth daily. -     amphetamine-dextroamphetamine (ADDERALL) 10 MG tablet; Take 1 tablet (10 mg total) by mouth daily. -     lisdexamfetamine (VYVANSE) 30 MG capsule; Take 1 capsule (30 mg total) by mouth daily. -     lisdexamfetamine (VYVANSE) 30 MG capsule; Take 1 capsule (30 mg total) by mouth daily. -     lisdexamfetamine (VYVANSE) 30 MG capsule; Take 1 capsule (30 mg total) by mouth daily. -     amphetamine-dextroamphetamine (ADDERALL) 10 MG tablet; Take 1/2-1 tab po qd     Please see After Visit Summary for patient specific instructions.  Future Appointments  Date Time Provider [place]  11/14/2019  1:30 PM Thayer Headings, PMHNP CP-CP None    No orders of the defined types were placed in this encounter.   -------------------------------

## 2019-08-14 NOTE — Progress Notes (Signed)
   08/14/19 1340  Facial and Oral Movements  Muscles of Facial Expression 0  Lips and Perioral Area 0  Jaw 0  Tongue 0  Extremity Movements  Upper (arms, wrists, hands, fingers) 0  Lower (legs, knees, ankles, toes) 0  Trunk Movements  Neck, shoulders, hips 0  Overall Severity  Severity of abnormal movements (highest score from questions above) 0  Incapacitation due to abnormal movements 0  Patient's awareness of abnormal movements (rate only patient's report) 0  AIMS Total Score  AIMS Total Score 0

## 2019-08-18 IMAGING — CT CT HEAD W/O CM
3 series · 14 of 47 positions shown, 16 images · non-contrast
Comparison: None.

CLINICAL DATA: Initial evaluation for acute right arm and hand
weakness.

EXAM:
CT HEAD WITHOUT CONTRAST
TECHNIQUE: Contiguous axial images were obtained from the base of the skull
through the vertex without intravenous contrast.

[Series 2: head wo · axial · 0.47mm/px · z∈[-140,-15]mm · 8 of 30 slices shown, 10 images]
[im 3/30  brain]
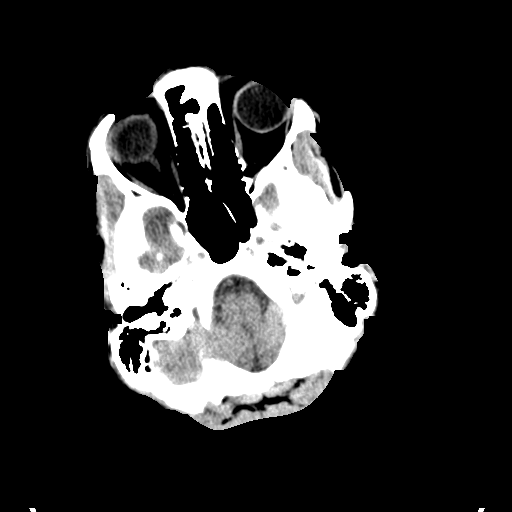
[im 3/30  bone]
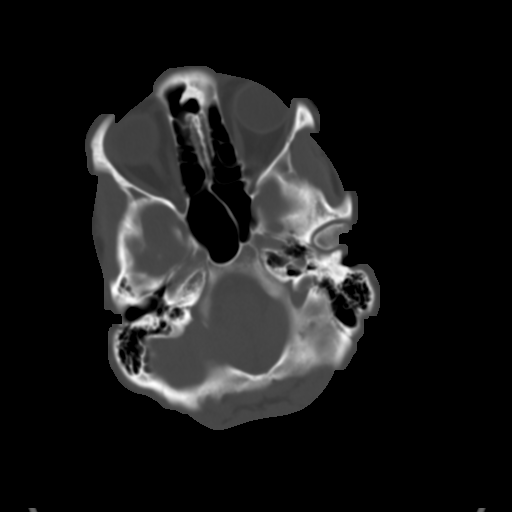
[im 7/30  brain]
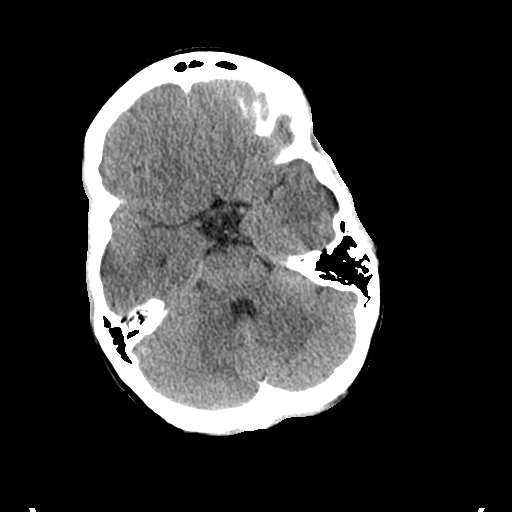
[im 10/30  brain]
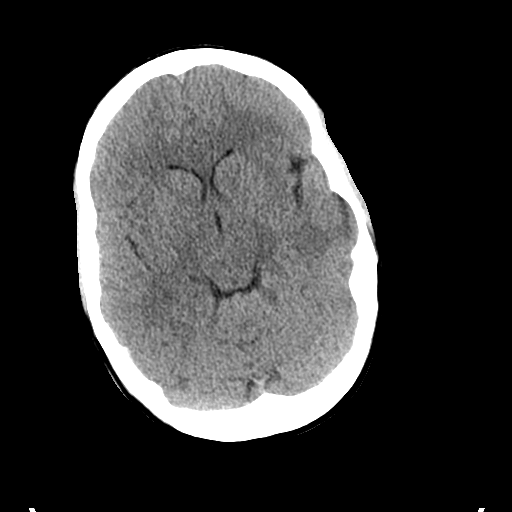
[im 14/30  brain]
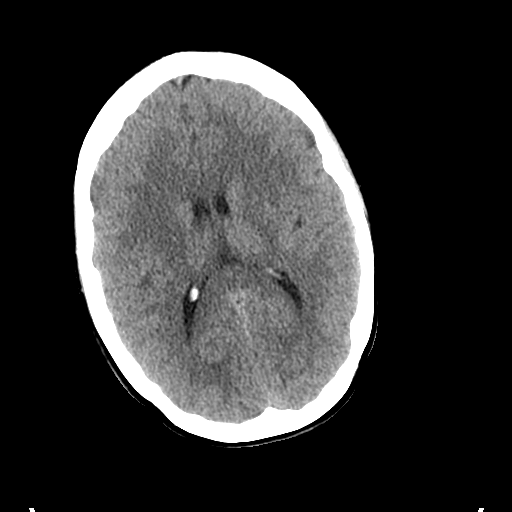
[im 17/30  brain]
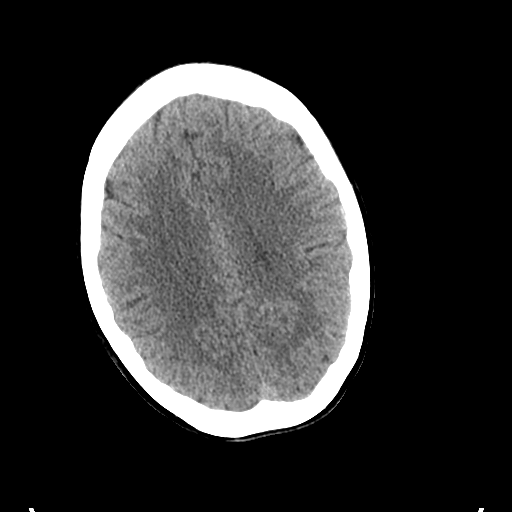
[im 17/30  bone]
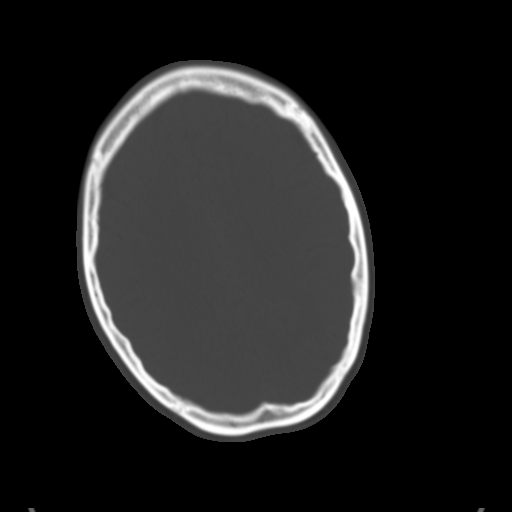
[im 21/30  brain]
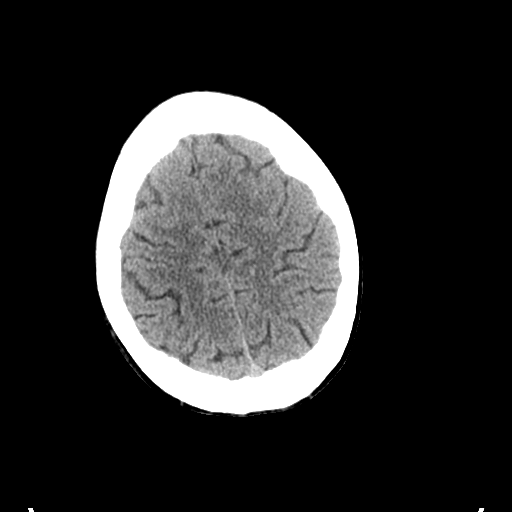
[im 24/30  brain]
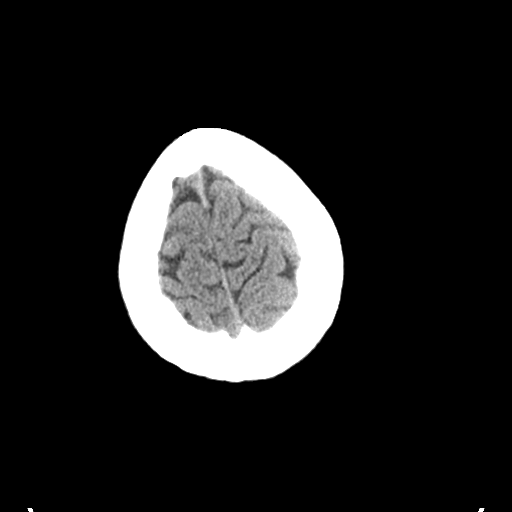
[im 28/30  brain]
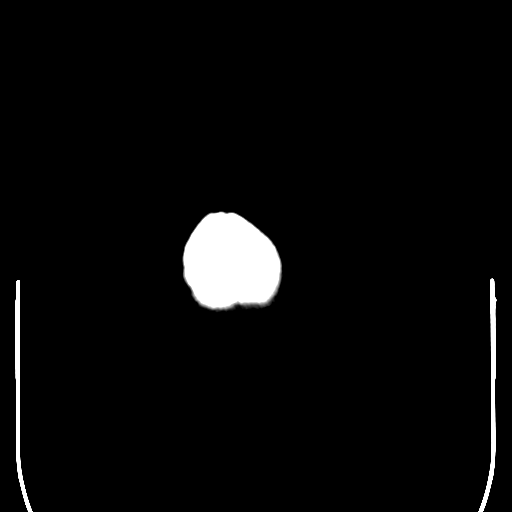

[Series 5: coronal soft tissue · coronal · 0.29mm/px · 3 of 72 slices shown]
[im 24/72  brain]
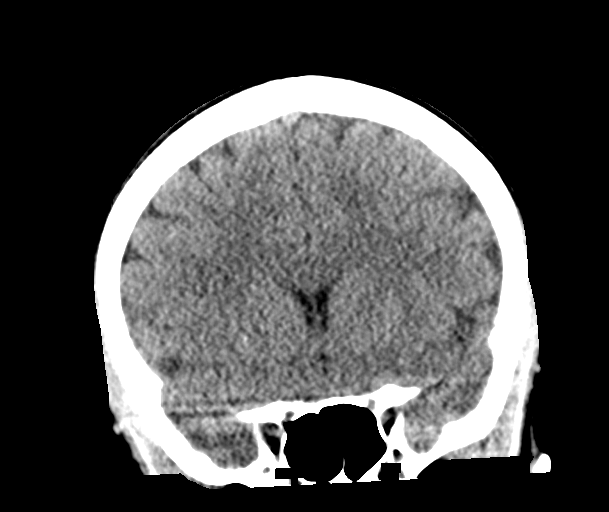
[im 32/72  brain]
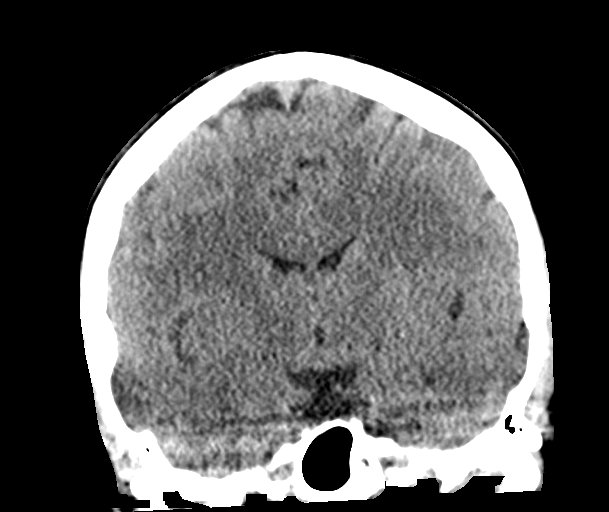
[im 40/72  brain]
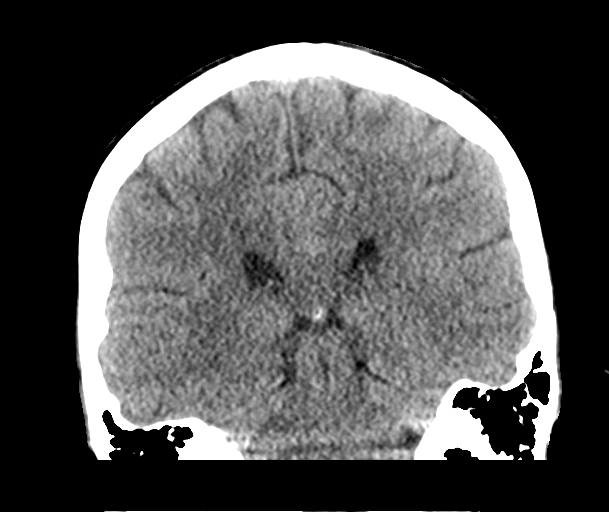

[Series 6: sagittal soft tissue · sagittal · 0.29mm/px · 3 of 66 slices shown]
[im 22/66  brain]
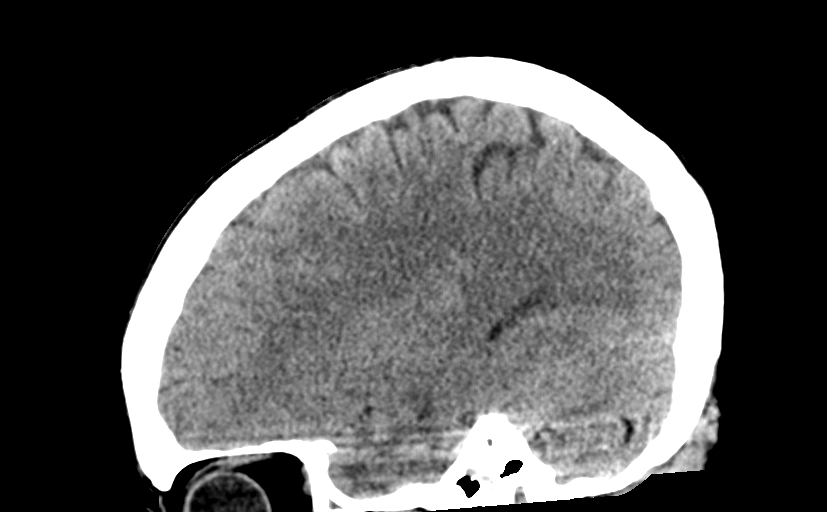
[im 33/66  brain]
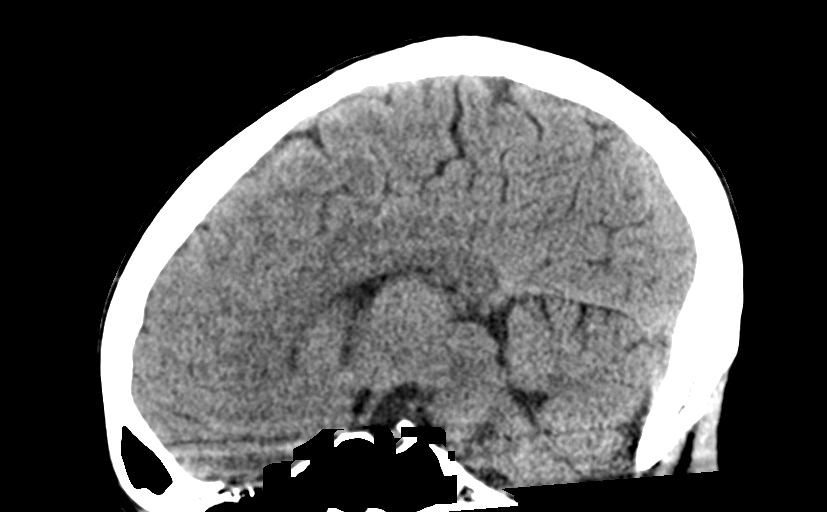
[im 44/66  brain]
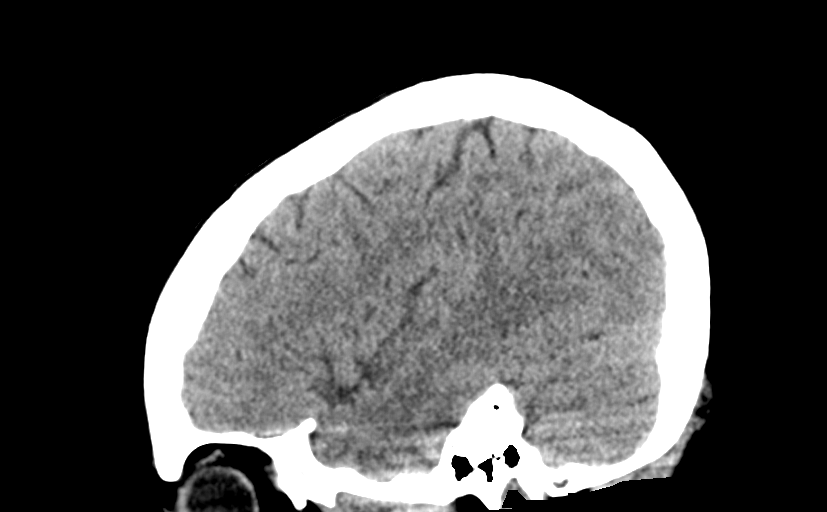

[14 of 47 positions shown; findings below may reference images not displayed]

FINDINGS: Brain: Cerebral volume within normal limits for patient age.

No evidence for acute intracranial hemorrhage. No findings to
suggest acute large vessel territory infarct. No mass lesion,
midline shift, or mass effect. Ventricles are normal in size without
evidence for hydrocephalus. No extra-axial fluid collection
identified.

Vascular: No hyperdense vessel identified.

Skull: Scalp soft tissues demonstrate no acute abnormality.
Calvarium intact.

Sinuses/Orbits: Globes and orbital soft tissues within normal
limits.

Visualized paranasal sinuses are clear. No mastoid effusion.
IMPRESSION: Normal head CT.  No acute intracranial abnormality identified.

## 2019-08-26 ENCOUNTER — Other Ambulatory Visit: Payer: Self-pay | Admitting: Psychiatry

## 2019-08-26 DIAGNOSIS — F900 Attention-deficit hyperactivity disorder, predominantly inattentive type: Secondary | ICD-10-CM

## 2019-08-26 MED ORDER — LISDEXAMFETAMINE DIMESYLATE 30 MG PO CAPS: 30.0000 mg | ORAL_CAPSULE | Freq: Every day | ORAL | 0 refills | Status: DC

## 2019-08-26 NOTE — Telephone Encounter (Signed)
Patient called and [facility] [place]. The address is [address]. Phone # [phone]. So cancel the one in here.

## 2019-09-03 ENCOUNTER — Other Ambulatory Visit: Payer: Self-pay | Admitting: Psychiatry

## 2019-09-03 DIAGNOSIS — F900 Attention-deficit hyperactivity disorder, predominantly inattentive type: Secondary | ICD-10-CM

## 2019-09-03 MED ORDER — AMPHETAMINE-DEXTROAMPHETAMINE 10 MG PO TABS: ORAL_TABLET | ORAL | 0 refills | Status: DC

## 2019-09-03 MED ORDER — LISDEXAMFETAMINE DIMESYLATE 30 MG PO CAPS: 30.0000 mg | ORAL_CAPSULE | Freq: Every day | ORAL | 0 refills | Status: DC

## 2019-09-03 NOTE — Telephone Encounter (Signed)
Pt called to refill her Vyvanse and Adderall . She is currently out of town and would like meds filled at the [facility] in [place] # [phone]

## 2019-09-04 ENCOUNTER — Telehealth: Payer: Self-pay | Admitting: Psychiatry

## 2019-09-04 DIAGNOSIS — F900 Attention-deficit hyperactivity disorder, predominantly inattentive type: Secondary | ICD-10-CM

## 2019-09-04 MED ORDER — LISDEXAMFETAMINE DIMESYLATE 30 MG PO CAPS: 30.0000 mg | ORAL_CAPSULE | Freq: Every day | ORAL | 0 refills | Status: DC

## 2019-09-04 NOTE — Telephone Encounter (Signed)
Pt's pharmacy that her refill Vyvanse was sent to is not in network with her insurance company. Pt would like it sent to [facility] in [place], [place]. [phone].

## 2019-09-23 ENCOUNTER — Other Ambulatory Visit: Payer: Self-pay | Admitting: Psychiatry

## 2019-09-23 DIAGNOSIS — F900 Attention-deficit hyperactivity disorder, predominantly inattentive type: Secondary | ICD-10-CM

## 2019-09-23 MED ORDER — AMPHETAMINE-DEXTROAMPHETAMINE 10 MG PO TABS: 10.0000 mg | ORAL_TABLET | Freq: Every day | ORAL | 0 refills | Status: DC

## 2019-09-23 NOTE — Telephone Encounter (Signed)
Pt called to report [facility] [place] [facility] was never picked up due to insurance not covering out of state. Pt ask to canc that Rx and [facility] [address]

## 2019-10-02 ENCOUNTER — Other Ambulatory Visit: Payer: Self-pay | Admitting: Psychiatry

## 2019-10-02 DIAGNOSIS — F411 Generalized anxiety disorder: Secondary | ICD-10-CM

## 2019-10-02 MED ORDER — HYDROXYZINE HCL 25 MG PO TABS: 50.0000 mg | ORAL_TABLET | Freq: Every evening | ORAL | 1 refills | Status: AC | PRN

## 2019-10-02 NOTE — Telephone Encounter (Signed)
Lynn Cole called to request help with getting her hydroxyzine refilled.  Last time she had it refill she was out of town and had the prescription transferred to a pharmacy where she was at the time.  Now she is trying to getting [facility] [address] but they can't seem to transfer it.  Will you please send in a new prescription to [facility] [address] here in [place].

## 2019-10-03 NOTE — Telephone Encounter (Signed)
Noted thank you

## 2019-11-14 ENCOUNTER — Ambulatory Visit (INDEPENDENT_AMBULATORY_CARE_PROVIDER_SITE_OTHER): Payer: BC Managed Care – PPO | Admitting: Psychiatry

## 2019-11-14 ENCOUNTER — Encounter: Payer: Self-pay | Admitting: Psychiatry

## 2019-11-14 ENCOUNTER — Other Ambulatory Visit: Payer: Self-pay

## 2019-11-14 DIAGNOSIS — F411 Generalized anxiety disorder: Secondary | ICD-10-CM

## 2019-11-14 DIAGNOSIS — F900 Attention-deficit hyperactivity disorder, predominantly inattentive type: Secondary | ICD-10-CM | POA: Diagnosis not present

## 2019-11-14 DIAGNOSIS — F39 Unspecified mood [affective] disorder: Secondary | ICD-10-CM | POA: Diagnosis not present

## 2019-11-14 MED ORDER — AMPHETAMINE-DEXTROAMPHETAMINE 10 MG PO TABS: 10.0000 mg | ORAL_TABLET | Freq: Every day | ORAL | 0 refills | Status: DC

## 2019-11-14 MED ORDER — LATUDA 60 MG PO TABS: ORAL_TABLET | ORAL | 3 refills | Status: DC

## 2019-11-14 MED ORDER — LISDEXAMFETAMINE DIMESYLATE 30 MG PO CAPS: 30.0000 mg | ORAL_CAPSULE | Freq: Every day | ORAL | 0 refills | Status: DC

## 2019-11-14 MED ORDER — BUSPIRONE HCL 15 MG PO TABS: 15.0000 mg | ORAL_TABLET | Freq: Two times a day (BID) | ORAL | 1 refills | Status: DC

## 2019-11-14 NOTE — Progress Notes (Signed)
Janazia Schreier [id number] 18-Mar-1991 29 y.o.  Subjective:   Patient ID:  Uriel Horkey is a 29 y.o. (DOB December 14, 1990) female.  Chief Complaint:  Chief Complaint  Patient presents with  . Follow-up    Mood, anxiety, insomnia, ADD    HPI Laquida Soth presents to the office today for follow-up of mood, anxiety, ADD. She reports that moods have been more up and down with some increase in elevated moods whereas in the past she had predominantly more depressive s/s. Reports mood fluctuations "does not feel that out of control" and has been more manageable. Has had a couple of days in the last few weeks where her energy was higher and was taking on more. Will notice elevated mood with more positive thoughts during those times. Denies any impulsivity or excessive spending, although she will notice some impulsive thoughts. Notices decreased sleep during hypomania. Hypomania is typically lasting a few days, and sometimes a week. Reports a week long period of depression after returning from a trip and another brief period of depression following period of hypomania. She reports that depressive episodes have been less severe without any SI.   She reports adequate sleep. Has difficulty getting out of bed in the mornings. Reports that she does not eat dinner until 7-8 pm and does not take Latuda until then. Typically eats a big breakfast, a snack, and then supper. She reports adequate concentration. Has worked with a Paramedic on strategies to help with ADD s/s to organize tasks and priorities. Appetite has been stable. Denies any recent episodes of binge eating. Denies any recent ETOH use. Reports that anxiety has been stable.   Was seeing a therapist until recently.   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    AIMS     Office Visit from 11/14/2019 in [facility] Office Visit from 08/14/2019 in [facility] Office Visit from 05/14/2019 in [facility]  AIMS Total Score  1  0  0    PHQ2-9     Office Visit from 08/23/2016 in Primary Care at [facility] Visit from 08/17/2016 in Primary Care at [facility] Total Score  0  0       Review of Systems:  Review of Systems  Cardiovascular: Negative for palpitations.  Musculoskeletal: Negative for gait problem.  Neurological: Negative for tremors.  Psychiatric/Behavioral:       Please refer to HPI    Medications: I have reviewed the patient's current medications.  Current Outpatient Medications  Medication Sig Dispense Refill  . [START ON 01/09/2020] amphetamine-dextroamphetamine (ADDERALL) 10 MG tablet Take 1 tablet (10 mg total) by mouth daily. 30 tablet 0  . Calcium Polycarbophil (FIBER-CAPS PO) Take by mouth.    . docusate sodium (COLACE) 100 MG capsule Take 100 mg by mouth 2 (two) times daily.    . hydrOXYzine (ATARAX/VISTARIL) 25 MG tablet Take 2-3 tablets (50-75 mg total) by mouth at bedtime as needed (sleep). 270 tablet 1  . [START ON 01/27/2020] lisdexamfetamine (VYVANSE) 30 MG capsule Take 1 capsule (30 mg total) by mouth daily. 30 capsule 0  . Lurasidone HCl (LATUDA) 60 MG TABS Take 1/2-1 tab po qd with evening meal 30 tablet 3  . MELATONIN PO Take 1 tablet by mouth at bedtime as needed (sleep).    . Probiotic Product (ALIGN PO) Take by mouth.    Melene Muller ON 12/12/2019] amphetamine-dextroamphetamine (ADDERALL) 10 MG tablet Take 1 tablet (10 mg total) by mouth daily. 30 tablet  0  . amphetamine-dextroamphetamine (ADDERALL) 10 MG tablet Take 1 tablet (10 mg total) by mouth daily. 30 tablet 0  . busPIRone (BUSPAR) 15 MG tablet Take 1 tablet (15 mg total) by mouth 2 (two) times daily. 180 tablet 1  . [START ON 12/30/2019] lisdexamfetamine (VYVANSE) 30 MG capsule Take 1 capsule (30 mg total) by mouth daily. 30 capsule 0  . [START ON 12/02/2019] lisdexamfetamine (VYVANSE) 30 MG capsule Take 1 capsule (30 mg total) by mouth daily. 30 capsule 0  . pilocarpine (SALAGEN) 5 MG tablet  TAKE 1 TABLET (5 MG TOTAL) BY MOUTH DAILY AS NEEDED. 90 tablet 0   No current facility-administered medications for this visit.    Medication Side Effects: Other: Excessive somnolence the morning after taking Latuda.   Allergies: No Known Allergies  Past Medical History:  Diagnosis Date  . IBS (irritable bowel syndrome)     Family History  Problem Relation Age of Onset  . Alcohol abuse Father   . Migraines Sister     Social History   Socioeconomic History  . Marital status: Single    Spouse name: Not on file  . Number of children: 0  . Years of education: Not on file  . Highest education level: Master's degree (e.g., MA, MS, MEng, MEd, MSW, MBA)  Occupational History  . Occupation: Corporate treasurer  Tobacco Use  . Smoking status: Never Smoker  . Smokeless tobacco: Never Used  Substance and Sexual Activity  . Alcohol use: Yes  . Drug use: Yes    Types: Cocaine  . Sexual activity: Not on file  Other Topics Concern  . Not on file  Social History Narrative   Patient is right-handed. She lives alone in a 2nd floor apartment. She drinks 1-3 cups of coffee a day. She runs 4-5 miles most days.   Social Determinants of Health   Financial Resource Strain:   . Difficulty of Paying Living Expenses: Not on file  Food Insecurity:   . Worried About Charity fundraiser in the Last Year: Not on file  . Ran Out of Food in the Last Year: Not on file  Transportation Needs:   . Lack of Transportation (Medical): Not on file  . Lack of Transportation (Non-Medical): Not on file  Physical Activity:   . Days of Exercise per Week: Not on file  . Minutes of Exercise per Session: Not on file  Stress:   . Feeling of Stress : Not on file  Social Connections:   . Frequency of Communication with Friends and Family: Not on file  . Frequency of Social Gatherings with Friends and Family: Not on file  . Attends Religious Services: Not on file  . Active Member of Clubs or Organizations: Not  on file  . Attends Archivist Meetings: Not on file  . Marital Status: Not on file  Intimate Partner Violence:   . Fear of Current or Ex-Partner: Not on file  . Emotionally Abused: Not on file  . Physically Abused: Not on file  . Sexually Abused: Not on file    Past Medical History, Surgical history, Social history, and Family history were reviewed and updated as appropriate.   Please see review of systems for further details on the patient's review from today.   Objective:   Physical Exam:  BP 102/72   Pulse 75   Wt 140 lb (63.5 kg)   BMI 21.76 kg/m   Physical Exam Constitutional:      General: She  is not in acute distress.    Appearance: She is well-developed.  Musculoskeletal:        General: No deformity.  Neurological:     Mental Status: She is alert and oriented to person, place, and time.     Coordination: Coordination normal.  Psychiatric:        Attention and Perception: Attention and perception normal. She does not perceive auditory or visual hallucinations.        Mood and Affect: Mood normal. Mood is not anxious or depressed. Affect is not labile, blunt, angry or inappropriate.        Speech: Speech normal.        Behavior: Behavior normal.        Thought Content: Thought content normal. Thought content is not paranoid or delusional. Thought content does not include homicidal or suicidal ideation. Thought content does not include homicidal or suicidal plan.        Cognition and Memory: Cognition and memory normal.        Judgment: Judgment normal.     Comments: Insight intact     Lab Review:     Component Value Date/Time   NA 141 06/06/2018 0353   K 3.5 06/06/2018 0353   CL 103 06/06/2018 0353   CO2 27 06/06/2018 0353   GLUCOSE 91 06/06/2018 0353   BUN 11 06/06/2018 0353   CREATININE 0.79 06/06/2018 0353   CALCIUM 9.5 06/06/2018 0353   PROT 6.3 (L) 06/06/2018 0353   ALBUMIN 4.4 06/06/2018 0353   AST 32 06/06/2018 0353   ALT 20 06/06/2018  0353   ALKPHOS 54 06/06/2018 0353   BILITOT 0.6 06/06/2018 0353   GFRNONAA >60 06/06/2018 0353   GFRAA >60 06/06/2018 0353       Component Value Date/Time   WBC 7.3 06/06/2018 0353   RBC 3.84 (L) 06/06/2018 0353   HGB 12.4 06/06/2018 0353   HCT 36.3 06/06/2018 0353   PLT 230 06/06/2018 0353   MCV 94.5 06/06/2018 0353   MCH 32.3 06/06/2018 0353   MCHC 34.2 06/06/2018 0353   RDW 12.8 06/06/2018 0353   LYMPHSABS 2.8 06/06/2018 0353   MONOABS 0.6 06/06/2018 0353   EOSABS 0.3 06/06/2018 0353   BASOSABS 0.0 06/06/2018 0353    No results found for: POCLITH, LITHIUM   No results found for: PHENYTOIN, PHENOBARB, VALPROATE, CBMZ   .res Assessment: Plan:   Patient seen for 30 minutes and time spent counseling patient regarding strategies to improve excessive somnolence in the mornings upon awakening.  Encourage patient to try taking Latuda several hours earlier with a meal to determine if this will decrease difficulty awakening in the morning.  Also counseled patient regarding transfer of care if she does relocate out of state in the next year.  Encourage patient to find any mental health provider as soon as possible in the area where she plans to relocate and that records can be provided with signed information release. Will continue all other medications as prescribed. Patient to follow-up in 3 months or sooner if clinically indicated. Patient advised to contact office with any questions, adverse effects, or acute worsening in signs and symptoms.  Itzelle was seen today for follow-up.  Diagnoses and all orders for this visit:  Attention deficit hyperactivity disorder (ADHD), predominantly inattentive type -     amphetamine-dextroamphetamine (ADDERALL) 10 MG tablet; Take 1 tablet (10 mg total) by mouth daily. -     amphetamine-dextroamphetamine (ADDERALL) 10 MG tablet; Take 1 tablet (10 mg  total) by mouth daily. -     amphetamine-dextroamphetamine (ADDERALL) 10 MG tablet; Take 1  tablet (10 mg total) by mouth daily. -     lisdexamfetamine (VYVANSE) 30 MG capsule; Take 1 capsule (30 mg total) by mouth daily. -     lisdexamfetamine (VYVANSE) 30 MG capsule; Take 1 capsule (30 mg total) by mouth daily. -     lisdexamfetamine (VYVANSE) 30 MG capsule; Take 1 capsule (30 mg total) by mouth daily.  Mood disorder (HCC) -     Lurasidone HCl (LATUDA) 60 MG TABS; Take 1/2-1 tab po qd with evening meal  Generalized anxiety disorder -     busPIRone (BUSPAR) 15 MG tablet; Take 1 tablet (15 mg total) by mouth 2 (two) times daily.     Please see After Visit Summary for patient specific instructions.  Future Appointments  Date Time Provider Department Center  02/11/2020  1:00 PM Corie Chiquito, PMHNP [facility] None    No orders of the defined types were placed in this encounter.   -------------------------------

## 2019-11-14 NOTE — Progress Notes (Signed)
   11/14/19 1400  Facial and Oral Movements  Muscles of Facial Expression 0  Lips and Perioral Area 0  Jaw 0  Tongue 0  Extremity Movements  Upper (arms, wrists, hands, fingers) 1  Lower (legs, knees, ankles, toes) 0  Trunk Movements  Neck, shoulders, hips 0  Overall Severity  Severity of abnormal movements (highest score from questions above) 0  Incapacitation due to abnormal movements 0  Patient's awareness of abnormal movements (rate only patient's report) 0  AIMS Total Score  AIMS Total Score 1

## 2020-02-04 ENCOUNTER — Telehealth: Payer: Self-pay

## 2020-02-04 NOTE — Telephone Encounter (Signed)
Prior authorization submitted and approved for LATUDA 60 MG #30 effective 02/03/2020-02/01/2023. [employer] [place] ID# [id number], bin [phone]   Submitted with cover my meds

## 2020-02-11 ENCOUNTER — Ambulatory Visit: Payer: BC Managed Care – PPO | Admitting: Psychiatry

## 2020-02-18 ENCOUNTER — Other Ambulatory Visit: Payer: Self-pay

## 2020-02-18 ENCOUNTER — Encounter: Payer: Self-pay | Admitting: Psychiatry

## 2020-02-18 ENCOUNTER — Ambulatory Visit (INDEPENDENT_AMBULATORY_CARE_PROVIDER_SITE_OTHER): Payer: Self-pay | Admitting: Psychiatry

## 2020-02-18 DIAGNOSIS — F900 Attention-deficit hyperactivity disorder, predominantly inattentive type: Secondary | ICD-10-CM | POA: Diagnosis not present

## 2020-02-18 DIAGNOSIS — F411 Generalized anxiety disorder: Secondary | ICD-10-CM | POA: Diagnosis not present

## 2020-02-18 DIAGNOSIS — F39 Unspecified mood [affective] disorder: Secondary | ICD-10-CM

## 2020-02-18 MED ORDER — AMPHETAMINE-DEXTROAMPHETAMINE 10 MG PO TABS: 10.0000 mg | ORAL_TABLET | Freq: Every day | ORAL | 0 refills | Status: AC

## 2020-02-18 MED ORDER — LISDEXAMFETAMINE DIMESYLATE 30 MG PO CAPS: 30.0000 mg | ORAL_CAPSULE | Freq: Every day | ORAL | 0 refills | Status: AC

## 2020-02-18 MED ORDER — LISDEXAMFETAMINE DIMESYLATE 30 MG PO CAPS: 30.0000 mg | ORAL_CAPSULE | Freq: Every day | ORAL | 0 refills | Status: DC

## 2020-02-18 MED ORDER — AMPHETAMINE-DEXTROAMPHETAMINE 10 MG PO TABS: 10.0000 mg | ORAL_TABLET | Freq: Every day | ORAL | 0 refills | Status: DC

## 2020-02-18 MED ORDER — BUSPIRONE HCL 15 MG PO TABS: 15.0000 mg | ORAL_TABLET | Freq: Two times a day (BID) | ORAL | 1 refills | Status: DC

## 2020-02-18 MED ORDER — LATUDA 60 MG PO TABS: ORAL_TABLET | ORAL | 3 refills | Status: AC

## 2020-02-18 NOTE — Progress Notes (Signed)
[name] [id number] [birth year] 29 y.o.  Subjective:   Patient ID:  Lynn Cole is a 29 y.o. (DOB [birth year]) female.  Chief Complaint:  Chief Complaint  Patient presents with  . Follow-up    Anxiety, mood disturbance, ADD    HPI Lynn Cole presents to the office today for follow-up of mood, anxiety, and ADD. Has accepted a job in [place] and will work remotely starting June 14th plans to start Sept 1. She reports that she has some apprehension about the move since she has had some difficulty with her mental health during transitions in the past. Has had some anxiety in the last few days about her new job and moving, and talking with current supervisor about moving. Reports that she has some anxiety with completing projects at current job and tying up lose ends. Has some anxiety with starting a 40 hour a week job. She reports that she has been managing anxiety. Denies panic attacks. She notices anxiety has been increased if she does not get adequate. Sleeping ok overall. Mood has been ok and "I feel more stable overall." Denies any significant depression. Has noticed some occ irritability. Has had some impulsivity and risky behavior and reports that this is not as severe compared to the past and is usually only a couple of days in duration. Energy and motivation has been good. Appetite has been adequate. Denies binge eating. Concentration has been adequate. Denies SI.   Reports that taking Latuda at 4 pm seems to work best to help with sleep and not over sleeping.   Her boyfriend will be going to school at [employer] and going with her. Boyfriend will be moving in with her this summer and they have started to down size.    AIMS     Office Visit from 11/14/2019 in [place] Visit from 08/14/2019 in [place] Visit from 05/14/2019 in [place] Total Score  1  0  0    PHQ2-9     Office Visit from 08/23/2016 in  Primary Care at [place] from 08/17/2016 in Primary Care at [facility] Total Score  0  0       Review of Systems:  Review of Systems  Cardiovascular: Negative for palpitations.  Musculoskeletal: Negative for gait problem.  Neurological: Negative for tremors.  Psychiatric/Behavioral:       Please refer to HPI    Medications: reviewed and reconciled  Current Outpatient Medications  Medication Sig Dispense Refill  . [START ON 04/26/2020] amphetamine-dextroamphetamine (ADDERALL) 10 MG tablet Take 1 tablet (10 mg total) by mouth daily. 30 tablet 0  . Calcium Polycarbophil (FIBER-CAPS PO) Take by mouth.    . docusate sodium (COLACE) 100 MG capsule Take 100 mg by mouth daily.     Derrill Memo ON 04/26/2020] lisdexamfetamine (VYVANSE) 30 MG capsule Take 1 capsule (30 mg total) by mouth daily. 30 capsule 0  . Lurasidone HCl (LATUDA) 60 MG TABS Take 1/2-1 tab po qd with evening meal 30 tablet 3  . MELATONIN PO Take 1 tablet by mouth at bedtime as needed (sleep).    . pilocarpine (SALAGEN) 5 MG tablet TAKE 1 TABLET (5 MG TOTAL) BY MOUTH DAILY AS NEEDED. 90 tablet 0  . Probiotic Product (ALIGN PO) Take by mouth.    Derrill Memo ON 03/29/2020] amphetamine-dextroamphetamine (ADDERALL) 10 MG tablet Take 1 tablet (10 mg total) by mouth daily. 30 tablet 0  . [START ON 03/01/2020] amphetamine-dextroamphetamine (ADDERALL)  10 MG tablet Take 1 tablet (10 mg total) by mouth daily. 30 tablet 0  . busPIRone (BUSPAR) 15 MG tablet Take 1 tablet (15 mg total) by mouth 2 (two) times daily. 180 tablet 1  . [START ON 03/29/2020] lisdexamfetamine (VYVANSE) 30 MG capsule Take 1 capsule (30 mg total) by mouth daily. 30 capsule 0  . [START ON 03/01/2020] lisdexamfetamine (VYVANSE) 30 MG capsule Take 1 capsule (30 mg total) by mouth daily. 30 capsule 0   No current facility-administered medications for this visit.    Medication Side Effects: Sleep Problems  Allergies: No Known Allergies  Past Medical History:   Diagnosis Date  . IBS (irritable bowel syndrome)     Family History  Problem Relation Age of Onset  . Alcohol abuse Father   . Migraines Sister     Social History   Socioeconomic History  . Marital status: Single    Spouse name: Not on file  . Number of children: 0  . Years of education: Not on file  . Highest education level: Master's degree (e.g., MA, MS, MEng, MEd, MSW, MBA)  Occupational History  . Occupation: Risk analyst  Tobacco Use  . Smoking status: Never Smoker  . Smokeless tobacco: Never Used  Substance and Sexual Activity  . Alcohol use: Not Currently  . Drug use: Not Currently  . Sexual activity: Not on file  Other Topics Concern  . Not on file  Social History Narrative   Patient is right-handed. She lives alone in a 2nd floor apartment. She drinks 1-3 cups of coffee a day. She runs 4-5 miles most days.   Social Determinants of Health   Financial Resource Strain:   . Difficulty of Paying Living Expenses:   Food Insecurity:   . Worried About Programme researcher, broadcasting/film/video in the Last Year:   . Barista in the Last Year:   Transportation Needs:   . Freight forwarder (Medical):   Marland Kitchen Lack of Transportation (Non-Medical):   Physical Activity:   . Days of Exercise per Week:   . Minutes of Exercise per Session:   Stress:   . Feeling of Stress :   Social Connections:   . Frequency of Communication with Friends and Family:   . Frequency of Social Gatherings with Friends and Family:   . Attends Religious Services:   . Active Member of Clubs or Organizations:   . Attends Banker Meetings:   Marland Kitchen Marital Status:   Intimate Partner Violence:   . Fear of Current or Ex-Partner:   . Emotionally Abused:   Marland Kitchen Physically Abused:   . Sexually Abused:     Past Medical History, Surgical history, Social history, and Family history were reviewed and updated as appropriate.   Please see review of systems for further details on the patient's review  from today.   Objective:   Physical Exam:  There were no vitals taken for this visit.  Physical Exam Constitutional:      General: She is not in acute distress. Musculoskeletal:        General: No deformity.  Neurological:     Mental Status: She is alert and oriented to person, place, and time.     Coordination: Coordination normal.  Psychiatric:        Attention and Perception: Attention and perception normal. She does not perceive auditory or visual hallucinations.        Mood and Affect: Mood normal. Mood is not anxious or depressed.  Affect is not labile, blunt, angry or inappropriate.        Speech: Speech normal.        Behavior: Behavior normal.        Thought Content: Thought content normal. Thought content is not paranoid or delusional. Thought content does not include homicidal or suicidal ideation. Thought content does not include homicidal or suicidal plan.        Cognition and Memory: Cognition and memory normal.        Judgment: Judgment normal.     Comments: Insight intact     Lab Review:     Component Value Date/Time   NA 141 06/06/2018 0353   K 3.5 06/06/2018 0353   CL 103 06/06/2018 0353   CO2 27 06/06/2018 0353   GLUCOSE 91 06/06/2018 0353   BUN 11 06/06/2018 0353   CREATININE 0.79 06/06/2018 0353   CALCIUM 9.5 06/06/2018 0353   PROT 6.3 (L) 06/06/2018 0353   ALBUMIN 4.4 06/06/2018 0353   AST 32 06/06/2018 0353   ALT 20 06/06/2018 0353   ALKPHOS 54 06/06/2018 0353   BILITOT 0.6 06/06/2018 0353   GFRNONAA >60 06/06/2018 0353   GFRAA >60 06/06/2018 0353       Component Value Date/Time   WBC 7.3 06/06/2018 0353   RBC 3.84 (L) 06/06/2018 0353   HGB 12.4 06/06/2018 0353   HCT 36.3 06/06/2018 0353   PLT 230 06/06/2018 0353   MCV 94.5 06/06/2018 0353   MCH 32.3 06/06/2018 0353   MCHC 34.2 06/06/2018 0353   RDW 12.8 06/06/2018 0353   LYMPHSABS 2.8 06/06/2018 0353   MONOABS 0.6 06/06/2018 0353   EOSABS 0.3 06/06/2018 0353   BASOSABS 0.0  06/06/2018 0353    No results found for: POCLITH, LITHIUM   No results found for: PHENYTOIN, PHENOBARB, VALPROATE, CBMZ   .res Assessment: Plan:   Pt seen for 30 minutes and time spent counseling pt re: upcoming transition with insurance and moving to [place]. Discussed that prior authorizations may be required when she transitions to new insurance and to contact office if she is having difficulty obtaining medication.  Continue Latuda 60 mg 1/2-1 tab po qd with a meal. Continue Buspar 15 mg po BID for anxiety.  Continue Vyvanse 30 mg po qd for ADD.  Continue Adderall 10 mg po qd for ADD.  Will continue current plan of care since target signs and symptoms are well controlled without any tolerability issues. Patient advised to contact office with any questions, adverse effects, or acute worsening in signs and symptoms. Pt to f/u in 3 months or sooner if clinically indicated.  Patient advised to contact office with any questions, adverse effects, or acute worsening in signs and symptoms.  Anda was seen today for follow-up.  Diagnoses and all orders for this visit:  Attention deficit hyperactivity disorder (ADHD), predominantly inattentive type -     amphetamine-dextroamphetamine (ADDERALL) 10 MG tablet; Take 1 tablet (10 mg total) by mouth daily. -     amphetamine-dextroamphetamine (ADDERALL) 10 MG tablet; Take 1 tablet (10 mg total) by mouth daily. -     amphetamine-dextroamphetamine (ADDERALL) 10 MG tablet; Take 1 tablet (10 mg total) by mouth daily. -     lisdexamfetamine (VYVANSE) 30 MG capsule; Take 1 capsule (30 mg total) by mouth daily. -     lisdexamfetamine (VYVANSE) 30 MG capsule; Take 1 capsule (30 mg total) by mouth daily. -     lisdexamfetamine (VYVANSE) 30 MG capsule; Take 1 capsule (30  mg total) by mouth daily.  Generalized anxiety disorder -     busPIRone (BUSPAR) 15 MG tablet; Take 1 tablet (15 mg total) by mouth 2 (two) times daily.  Mood disorder (HCC) -      Lurasidone HCl (LATUDA) 60 MG TABS; Take 1/2-1 tab po qd with evening meal     Please see After Visit Summary for patient specific instructions.  No future appointments.  No orders of the defined types were placed in this encounter.   -------------------------------

## 2020-05-20 ENCOUNTER — Ambulatory Visit (INDEPENDENT_AMBULATORY_CARE_PROVIDER_SITE_OTHER): Payer: BC Managed Care – PPO | Admitting: Psychiatry

## 2020-05-20 ENCOUNTER — Encounter: Payer: Self-pay | Admitting: Psychiatry

## 2020-05-20 ENCOUNTER — Other Ambulatory Visit: Payer: Self-pay

## 2020-05-20 VITALS — BP 139/85 | HR 81

## 2020-05-20 DIAGNOSIS — F39 Unspecified mood [affective] disorder: Secondary | ICD-10-CM | POA: Diagnosis not present

## 2020-05-20 DIAGNOSIS — F900 Attention-deficit hyperactivity disorder, predominantly inattentive type: Secondary | ICD-10-CM

## 2020-05-20 DIAGNOSIS — F411 Generalized anxiety disorder: Secondary | ICD-10-CM | POA: Diagnosis not present

## 2020-05-20 MED ORDER — LISDEXAMFETAMINE DIMESYLATE 30 MG PO CAPS: 30.0000 mg | ORAL_CAPSULE | Freq: Every day | ORAL | 0 refills | Status: AC

## 2020-05-20 MED ORDER — BUSPIRONE HCL 15 MG PO TABS: 15.0000 mg | ORAL_TABLET | Freq: Two times a day (BID) | ORAL | 1 refills | Status: AC

## 2020-05-20 MED ORDER — AMPHETAMINE-DEXTROAMPHETAMINE 10 MG PO TABS: 10.0000 mg | ORAL_TABLET | Freq: Every day | ORAL | 0 refills | Status: AC

## 2020-05-20 MED ORDER — HYDROXYZINE HCL 25 MG PO TABS: 50.0000 mg | ORAL_TABLET | Freq: Every day | ORAL | 0 refills | Status: AC

## 2020-05-20 NOTE — Progress Notes (Signed)
Lynn Cole [id number] 1990/11/26 29 y.o.  Subjective:   Patient ID:  Lynn Cole is a 29 y.o. (DOB [birth year]) female.  Chief Complaint:  Chief Complaint  Patient presents with  . Follow-up    h/o Bipolar D/O, anxiety, ADD    HPI Lynn Cole presents to the office today for follow-up of mood disturbance, anxiety, and ADD. Planning to move to [place] in about 2 weeks. She reports that she is trying to prepare for transition as much as possible in advance. Has started new job remotely and reports that this is going well. She reports that her anxiety has been ok. Denies any panic s/s. Mood has been "fine." Reports that mood has been more down in the past month "but doesn't feel extreme." She reports periods of down moods with periods of improved mood. Reports that the last couple of days she was felt stressed "and almost excited... manic." Denies any risky or impulsive behavior. She attributes down mood to situational factors with change in daily schedule with new job and focusing on her new job. Reports that she has not had much energy for activities other than work and having to accomplish tasks related to the move. Has had less time for activities she enjoys, such as writing. Has felt somewhat isolated. She reports that she is getting regular sleep and has most consistent sleep schedule that she has ever had. Has been able to wake up in time for work. Motivation has been ok. Concentration has been adequate. Appetite has been good and is now eating meals at more consistent times. Denies SI.   AIMS     Office Visit from 05/20/2020 in [facility] Office Visit from 11/14/2019 in [facility] Office Visit from 08/14/2019 in [facility] Office Visit from 05/14/2019 in [facility]  AIMS Total Score 0 1 0 0    [id number]     Office Visit from 08/23/2016 in Primary Care at [facility] Visit from 08/17/2016 in Primary Care at [facility] Total Score 0 0       Review of Systems:  Review of Systems  Constitutional:       Had tick bite and was dx'd with Lyme disease.   Musculoskeletal: Negative for gait problem.  Skin:       Recent sunburn  Neurological: Negative for tremors.  Psychiatric/Behavioral:       Please refer to HPI    Medications: I have reviewed the patient's current medications.  Current Outpatient Medications  Medication Sig Dispense Refill  . amphetamine-dextroamphetamine (ADDERALL) 10 MG tablet Take 1 tablet (10 mg total) by mouth daily. 60 tablet 0  . Calcium Polycarbophil (FIBER-CAPS PO) Take by mouth.    . docusate sodium (COLACE) 100 MG capsule Take 100 mg by mouth daily.     Marland Kitchen doxycycline (MONODOX) 100 MG capsule Take by mouth.    . hydrOXYzine (ATARAX/VISTARIL) 25 MG tablet Take 2 tablets (50 mg total) by mouth at bedtime. 180 tablet 0  . lisdexamfetamine (VYVANSE) 30 MG capsule Take 1 capsule (30 mg total) by mouth daily. 30 capsule 0  . Lurasidone HCl (LATUDA) 60 MG TABS Take 1/2-1 tab po qd with evening meal 30 tablet 3  . MELATONIN PO Take 1 tablet by mouth at bedtime as needed (sleep).    . pilocarpine (SALAGEN) 5 MG tablet TAKE 1 TABLET (5 MG TOTAL) BY MOUTH DAILY AS NEEDED. 90 tablet 0  . Probiotic Product (ALIGN PO) Take by mouth.    Marland Kitchen  amphetamine-dextroamphetamine (ADDERALL) 10 MG tablet Take 1 tablet (10 mg total) by mouth daily. 30 tablet 0  . amphetamine-dextroamphetamine (ADDERALL) 10 MG tablet Take 1 tablet (10 mg total) by mouth daily. 30 tablet 0  . busPIRone (BUSPAR) 15 MG tablet Take 1 tablet (15 mg total) by mouth 2 (two) times daily. 180 tablet 1  . [START ON 06/04/2020] lisdexamfetamine (VYVANSE) 30 MG capsule Take 1 capsule (30 mg total) by mouth daily. 60 capsule 0  . [START ON 07/30/2020] lisdexamfetamine (VYVANSE) 30 MG capsule Take 1 capsule (30 mg total) by mouth daily. 30 capsule 0   No current facility-administered medications for this visit.    Medication  Side Effects: None  Allergies: No Known Allergies  Past Medical History:  Diagnosis Date  . IBS (irritable bowel syndrome)     Family History  Problem Relation Age of Onset  . Alcohol abuse Father   . Migraines Sister     Social History   Socioeconomic History  . Marital status: Single    Spouse name: Not on file  . Number of children: 0  . Years of education: Not on file  . Highest education level: Master's degree (e.g., MA, MS, MEng, MEd, MSW, MBA)  Occupational History  . Occupation: Risk analyst  Tobacco Use  . Smoking status: Never Smoker  . Smokeless tobacco: Never Used  Vaping Use  . Vaping Use: Never used  Substance and Sexual Activity  . Alcohol use: Not Currently  . Drug use: Not Currently  . Sexual activity: Not on file  Other Topics Concern  . Not on file  Social History Narrative   Patient is right-handed. She lives alone in a 2nd floor apartment. She drinks 1-3 cups of coffee a day. She runs 4-5 miles most days.   Social Determinants of Health   Financial Resource Strain:   . Difficulty of Paying Living Expenses:   Food Insecurity:   . Worried About Programme researcher, broadcasting/film/video in the Last Year:   . Barista in the Last Year:   Transportation Needs:   . Freight forwarder (Medical):   Marland Kitchen Lack of Transportation (Non-Medical):   Physical Activity:   . Days of Exercise per Week:   . Minutes of Exercise per Session:   Stress:   . Feeling of Stress :   Social Connections:   . Frequency of Communication with Friends and Family:   . Frequency of Social Gatherings with Friends and Family:   . Attends Religious Services:   . Active Member of Clubs or Organizations:   . Attends Banker Meetings:   Marland Kitchen Marital Status:   Intimate Partner Violence:   . Fear of Current or Ex-Partner:   . Emotionally Abused:   Marland Kitchen Physically Abused:   . Sexually Abused:     Past Medical History, Surgical history, Social history, and Family history were  reviewed and updated as appropriate.   Please see review of systems for further details on the patient's review from today.   Objective:   Physical Exam:  BP 139/85   Pulse 81   Physical Exam Constitutional:      General: She is not in acute distress. Musculoskeletal:        General: No deformity.  Neurological:     Mental Status: She is alert and oriented to person, place, and time.     Coordination: Coordination normal.  Psychiatric:        Attention and Perception: Attention and  perception normal. She does not perceive auditory or visual hallucinations.        Mood and Affect: Mood normal. Mood is not anxious or depressed. Affect is not labile, blunt, angry or inappropriate.        Speech: Speech normal.        Behavior: Behavior normal.        Thought Content: Thought content normal. Thought content is not paranoid or delusional. Thought content does not include homicidal or suicidal ideation. Thought content does not include homicidal or suicidal plan.        Cognition and Memory: Cognition and memory normal.        Judgment: Judgment normal.     Comments: Insight intact Mood is      Lab Review:     Component Value Date/Time   NA 141 06/06/2018 0353   K 3.5 06/06/2018 0353   CL 103 06/06/2018 0353   CO2 27 06/06/2018 0353   GLUCOSE 91 06/06/2018 0353   BUN 11 06/06/2018 0353   CREATININE 0.79 06/06/2018 0353   CALCIUM 9.5 06/06/2018 0353   PROT 6.3 (L) 06/06/2018 0353   ALBUMIN 4.4 06/06/2018 0353   AST 32 06/06/2018 0353   ALT 20 06/06/2018 0353   ALKPHOS 54 06/06/2018 0353   BILITOT 0.6 06/06/2018 0353   GFRNONAA >60 06/06/2018 0353   GFRAA >60 06/06/2018 0353       Component Value Date/Time   WBC 7.3 06/06/2018 0353   RBC 3.84 (L) 06/06/2018 0353   HGB 12.4 06/06/2018 0353   HCT 36.3 06/06/2018 0353   PLT 230 06/06/2018 0353   MCV 94.5 06/06/2018 0353   MCH 32.3 06/06/2018 0353   MCHC 34.2 06/06/2018 0353   RDW 12.8 06/06/2018 0353   LYMPHSABS  2.8 06/06/2018 0353   MONOABS 0.6 06/06/2018 0353   EOSABS 0.3 06/06/2018 0353   BASOSABS 0.0 06/06/2018 0353    No results found for: POCLITH, LITHIUM   No results found for: PHENYTOIN, PHENOBARB, VALPROATE, CBMZ   .res Assessment: Plan:   Will continue current plan of care since target signs and symptoms are well controlled without any tolerability issues. Discussed transitioning care to a provider in [place] and obtained information release for new provider. Pt requests 60 day supply of stimulants to ensure adequate supply after moving in the event she is unable to obtain scripts in [place] from an out of state prescriber. Refills sent all medications. Advised pt that additional refills can be sent to pharmacy if needed to avoid lapse in medication while transitioning care.  Continue Latuda 60 mg 1/2 tab po qd for mood s/s. Continue Vyvanse 30 mg po qd for ADD. Continue Adderall 10 mg po qd prn for ADD.  Continue Hydroxyzine 50 mg po QHS for anxiety and insomnia.  Continue Buspar 15 mg po BID for anxiety.  Pt to f/u only as needed.  Patient advised to contact office with any questions, adverse effects, or acute worsening in signs and symptoms.    Lether was seen today for follow-up.  Diagnoses and all orders for this visit:  Mood disorder (HCC)  Attention deficit hyperactivity disorder (ADHD), predominantly inattentive type -     amphetamine-dextroamphetamine (ADDERALL) 10 MG tablet; Take 1 tablet (10 mg total) by mouth daily. -     lisdexamfetamine (VYVANSE) 30 MG capsule; Take 1 capsule (30 mg total) by mouth daily. -     lisdexamfetamine (VYVANSE) 30 MG capsule; Take 1 capsule (30 mg total) by mouth daily.  Generalized anxiety disorder -     busPIRone (BUSPAR) 15 MG tablet; Take 1 tablet (15 mg total) by mouth 2 (two) times daily.  Other orders -     hydrOXYzine (ATARAX/VISTARIL) 25 MG tablet; Take 2 tablets (50 mg total) by mouth at bedtime.     Please see After  Visit Summary for patient specific instructions.  No future appointments.  No orders of the defined types were placed in this encounter.   -------------------------------

## 2020-05-20 NOTE — Progress Notes (Signed)
   05/20/20 0848  Facial and Oral Movements  Muscles of Facial Expression 0  Lips and Perioral Area 0  Jaw 0  Tongue 0  Extremity Movements  Upper (arms, wrists, hands, fingers) 0  Lower (legs, knees, ankles, toes) 0  Trunk Movements  Neck, shoulders, hips 0  Overall Severity  Severity of abnormal movements (highest score from questions above) 0  Incapacitation due to abnormal movements 0  Patient's awareness of abnormal movements (rate only patient's report) 0  AIMS Total Score  AIMS Total Score 0

## 2020-08-10 ENCOUNTER — Telehealth: Payer: Self-pay | Admitting: Psychiatry

## 2020-08-10 NOTE — Telephone Encounter (Signed)
[facility] in [place], [place] called for refill on patient for Vyvanse. Last seen on 08/21. Left message to call and schedule an appt. Pharmacy will tell her to call for f/u also. There # is [phone].

## 2020-08-10 NOTE — Telephone Encounter (Signed)
Last Rx sent locally. This must be a new pharmacy. Okay to pend?

## 2020-08-11 ENCOUNTER — Other Ambulatory Visit: Payer: Self-pay

## 2020-08-11 DIAGNOSIS — F900 Attention-deficit hyperactivity disorder, predominantly inattentive type: Secondary | ICD-10-CM

## 2020-08-11 NOTE — Telephone Encounter (Signed)
Pended for Jessica to send 

## 2020-08-12 MED ORDER — AMPHETAMINE-DEXTROAMPHETAMINE 10 MG PO TABS: 10.0000 mg | ORAL_TABLET | Freq: Every day | ORAL | 0 refills | Status: AC

## 2023-08-16 ENCOUNTER — Encounter: Payer: Self-pay | Admitting: Psychiatry
